# Patient Record
Sex: Female | Born: 1965 | Race: Black or African American | Hispanic: No | Marital: Single | Smoking: Never smoker
Health system: Southern US, Community
[De-identification: ages and names within clinical notes are randomized; demographics above are authoritative.]

## PROBLEM LIST (undated history)

## (undated) DIAGNOSIS — E119 Type 2 diabetes mellitus without complications: Secondary | ICD-10-CM

## (undated) DIAGNOSIS — I10 Essential (primary) hypertension: Secondary | ICD-10-CM

## (undated) DIAGNOSIS — F329 Major depressive disorder, single episode, unspecified: Secondary | ICD-10-CM

## (undated) DIAGNOSIS — F32A Depression, unspecified: Secondary | ICD-10-CM

## (undated) HISTORY — DX: Depression, unspecified: F32.A

## (undated) HISTORY — PX: ABDOMINAL HYSTERECTOMY: SHX81

---
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
## 2005-05-03 ENCOUNTER — Ambulatory Visit: Payer: Self-pay | Admitting: Family Medicine

## 2006-05-05 ENCOUNTER — Ambulatory Visit: Payer: Self-pay | Admitting: Family Medicine

## 2007-07-18 ENCOUNTER — Ambulatory Visit: Payer: Self-pay | Admitting: Family Medicine

## 2008-09-24 ENCOUNTER — Ambulatory Visit: Payer: Self-pay | Admitting: Family Medicine

## 2010-01-07 ENCOUNTER — Ambulatory Visit: Payer: Self-pay | Admitting: Family Medicine

## 2010-03-07 ENCOUNTER — Emergency Department: Payer: Self-pay | Admitting: Unknown Physician Specialty

## 2010-03-08 ENCOUNTER — Emergency Department: Payer: Self-pay | Admitting: Emergency Medicine

## 2010-07-21 ENCOUNTER — Emergency Department: Payer: Self-pay | Admitting: Emergency Medicine

## 2010-07-23 ENCOUNTER — Ambulatory Visit: Payer: Self-pay | Admitting: Obstetrics and Gynecology

## 2010-07-27 ENCOUNTER — Ambulatory Visit: Payer: Self-pay | Admitting: Obstetrics and Gynecology

## 2011-02-03 ENCOUNTER — Ambulatory Visit: Payer: Self-pay | Admitting: Family Medicine

## 2012-02-14 ENCOUNTER — Ambulatory Visit: Payer: Self-pay | Admitting: Family Medicine

## 2013-03-29 HISTORY — PX: BREAST BIOPSY: SHX20

## 2013-06-18 ENCOUNTER — Ambulatory Visit: Payer: Self-pay | Admitting: Family Medicine

## 2013-06-20 ENCOUNTER — Ambulatory Visit: Payer: Self-pay | Admitting: Family Medicine

## 2013-06-25 ENCOUNTER — Ambulatory Visit: Payer: Self-pay | Admitting: Family Medicine

## 2013-06-26 LAB — PATHOLOGY REPORT

## 2013-11-06 ENCOUNTER — Ambulatory Visit: Payer: Self-pay | Admitting: Family Medicine

## 2013-11-07 ENCOUNTER — Ambulatory Visit: Payer: Self-pay | Admitting: Gastroenterology

## 2013-11-27 ENCOUNTER — Ambulatory Visit: Payer: Self-pay | Admitting: Family Medicine

## 2014-02-04 ENCOUNTER — Ambulatory Visit: Payer: Self-pay | Admitting: Family Medicine

## 2014-02-26 ENCOUNTER — Ambulatory Visit: Payer: Self-pay | Admitting: Family Medicine

## 2014-07-04 ENCOUNTER — Ambulatory Visit
Admit: 2014-07-04 | Disposition: A | Discharge status: Home / Self Care | Payer: Self-pay | Attending: Family Medicine | Admitting: Family Medicine

## 2015-01-09 ENCOUNTER — Encounter: Payer: Self-pay | Admitting: Emergency Medicine

## 2015-01-09 ENCOUNTER — Emergency Department
Admission: EM | Admit: 2015-01-09 | Discharge: 2015-01-09 | Disposition: A | Discharge status: Home / Self Care | Payer: Federal, State, Local not specified - PPO | Attending: Emergency Medicine | Admitting: Emergency Medicine

## 2015-01-09 DIAGNOSIS — R61 Generalized hyperhidrosis: Secondary | ICD-10-CM | POA: Insufficient documentation

## 2015-01-09 DIAGNOSIS — E119 Type 2 diabetes mellitus without complications: Secondary | ICD-10-CM | POA: Diagnosis not present

## 2015-01-09 DIAGNOSIS — R079 Chest pain, unspecified: Secondary | ICD-10-CM

## 2015-01-09 DIAGNOSIS — Z79899 Other long term (current) drug therapy: Secondary | ICD-10-CM | POA: Insufficient documentation

## 2015-01-09 DIAGNOSIS — I10 Essential (primary) hypertension: Secondary | ICD-10-CM | POA: Diagnosis not present

## 2015-01-09 DIAGNOSIS — R0789 Other chest pain: Secondary | ICD-10-CM | POA: Diagnosis not present

## 2015-01-09 HISTORY — DX: Essential (primary) hypertension: I10

## 2015-01-09 HISTORY — DX: Type 2 diabetes mellitus without complications: E11.9

## 2015-01-09 LAB — COMPREHENSIVE METABOLIC PANEL
ALT: 29 U/L (ref 14–54)
ANION GAP: 11 (ref 5–15)
AST: 24 U/L (ref 15–41)
Albumin: 4.1 g/dL (ref 3.5–5.0)
Alkaline Phosphatase: 97 U/L (ref 38–126)
BILIRUBIN TOTAL: 0.7 mg/dL (ref 0.3–1.2)
BUN: 9 mg/dL (ref 6–20)
CO2: 23 mmol/L (ref 22–32)
Calcium: 9.3 mg/dL (ref 8.9–10.3)
Chloride: 106 mmol/L (ref 101–111)
Creatinine, Ser: 0.67 mg/dL (ref 0.44–1.00)
Glucose, Bld: 100 mg/dL — ABNORMAL HIGH (ref 65–99)
POTASSIUM: 3.5 mmol/L (ref 3.5–5.1)
Sodium: 140 mmol/L (ref 135–145)
TOTAL PROTEIN: 7.6 g/dL (ref 6.5–8.1)

## 2015-01-09 LAB — CBC
HEMATOCRIT: 46.5 % (ref 35.0–47.0)
Hemoglobin: 15.5 g/dL (ref 12.0–16.0)
MCH: 28.3 pg (ref 26.0–34.0)
MCHC: 33.4 g/dL (ref 32.0–36.0)
MCV: 84.8 fL (ref 80.0–100.0)
PLATELETS: 249 10*3/uL (ref 150–440)
RBC: 5.48 MIL/uL — ABNORMAL HIGH (ref 3.80–5.20)
RDW: 13.8 % (ref 11.5–14.5)
WBC: 10.3 10*3/uL (ref 3.6–11.0)

## 2015-01-09 LAB — TROPONIN I

## 2015-01-09 LAB — FIBRIN DERIVATIVES D-DIMER (ARMC ONLY): Fibrin derivatives D-dimer (ARMC): 255 (ref 0–499)

## 2015-01-09 LAB — GLUCOSE, CAPILLARY: Glucose-Capillary: 100 mg/dL — ABNORMAL HIGH (ref 65–99)

## 2015-01-09 MED ORDER — RANITIDINE HCL 150 MG PO CAPS
150.0000 mg | ORAL_CAPSULE | Freq: Two times a day (BID) | ORAL | Status: DC
Start: 2015-01-09 — End: 2016-12-08

## 2015-01-09 MED ORDER — SUCRALFATE 1 G PO TABS: 1.0000 g | ORAL_TABLET | Freq: Four times a day (QID) | ORAL | Status: DC

## 2015-01-09 NOTE — Discharge Instructions (Signed)
Nonspecific Chest Pain  °Chest pain can be caused by many different conditions. There is always a chance that your pain could be related to something serious, such as a heart attack or a blood clot in your lungs. Chest pain can also be caused by conditions that are not life-threatening. If you have chest pain, it is very important to follow up with your health care provider. °CAUSES  °Chest pain can be caused by: °· Heartburn. °· Pneumonia or bronchitis. °· Anxiety or stress. °· Inflammation around your heart (pericarditis) or lung (pleuritis or pleurisy). °· A blood clot in your lung. °· A collapsed lung (pneumothorax). It can develop suddenly on its own (spontaneous pneumothorax) or from trauma to the chest. °· Shingles infection (varicella-zoster virus). °· Heart attack. °· Damage to the bones, muscles, and cartilage that make up your chest wall. This can include: °¨ Bruised bones due to injury. °¨ Strained muscles or cartilage due to frequent or repeated coughing or overwork. °¨ Fracture to one or more ribs. °¨ Sore cartilage due to inflammation (costochondritis). °RISK FACTORS  °Risk factors for chest pain may include: °· Activities that increase your risk for trauma or injury to your chest. °· Respiratory infections or conditions that cause frequent coughing. °· Medical conditions or overeating that can cause heartburn. °· Heart disease or family history of heart disease. °· Conditions or health behaviors that increase your risk of developing a blood clot. °· Having had chicken pox (varicella zoster). °SIGNS AND SYMPTOMS °Chest pain can feel like: °· Burning or tingling on the surface of your chest or deep in your chest. °· Crushing, pressure, aching, or squeezing pain. °· Dull or sharp pain that is worse when you move, cough, or take a deep breath. °· Pain that is also felt in your back, neck, shoulder, or arm, or pain that spreads to any of these areas. °Your chest pain may come and go, or it may stay  constant. °DIAGNOSIS °Lab tests or other studies may be needed to find the cause of your pain. Your health care provider may have you take a test called an ambulatory ECG (electrocardiogram). An ECG records your heartbeat patterns at the time the test is performed. You may also have other tests, such as: °· Transthoracic echocardiogram (TTE). During echocardiography, sound waves are used to create a picture of all of the heart structures and to look at how blood flows through your heart. °· Transesophageal echocardiogram (TEE). This is a more advanced imaging test that obtains images from inside your body. It allows your health care provider to see your heart in finer detail. °· Cardiac monitoring. This allows your health care provider to monitor your heart rate and rhythm in real time. °· Holter monitor. This is a portable device that records your heartbeat and can help to diagnose abnormal heartbeats. It allows your health care provider to track your heart activity for several days, if needed. °· Stress tests. These can be done through exercise or by taking medicine that makes your heart beat more quickly. °· Blood tests. °· Imaging tests. °TREATMENT  °Your treatment depends on what is causing your chest pain. Treatment may include: °· Medicines. These may include: °¨ Acid blockers for heartburn. °¨ Anti-inflammatory medicine. °¨ Pain medicine for inflammatory conditions. °¨ Antibiotic medicine, if an infection is present. °¨ Medicines to dissolve blood clots. °¨ Medicines to treat coronary artery disease. °· Supportive care for conditions that do not require medicines. This may include: °¨ Resting. °¨ Applying heat   or cold packs to injured areas. °¨ Limiting activities until pain decreases. °HOME CARE INSTRUCTIONS °· If you were prescribed an antibiotic medicine, finish it all even if you start to feel better. °· Avoid any activities that bring on chest pain. °· Do not use any tobacco products, including  cigarettes, chewing tobacco, or electronic cigarettes. If you need help quitting, ask your health care provider. °· Do not drink alcohol. °· Take medicines only as directed by your health care provider. °· Keep all follow-up visits as directed by your health care provider. This is important. This includes any further testing if your chest pain does not go away. °· If heartburn is the cause for your chest pain, you may be told to keep your head raised (elevated) while sleeping. This reduces the chance that acid will go from your stomach into your esophagus. °· Make lifestyle changes as directed by your health care provider. These may include: °¨ Getting regular exercise. Ask your health care provider to suggest some activities that are safe for you. °¨ Eating a heart-healthy diet. A registered dietitian can help you to learn healthy eating options. °¨ Maintaining a healthy weight. °¨ Managing diabetes, if necessary. °¨ Reducing stress. °SEEK MEDICAL CARE IF: °· Your chest pain does not go away after treatment. °· You have a rash with blisters on your chest. °· You have a fever. °SEEK IMMEDIATE MEDICAL CARE IF:  °· Your chest pain is worse. °· You have an increasing cough, or you cough up blood. °· You have severe abdominal pain. °· You have severe weakness. °· You faint. °· You have chills. °· You have sudden, unexplained chest discomfort. °· You have sudden, unexplained discomfort in your arms, back, neck, or jaw. °· You have shortness of breath at any time. °· You suddenly start to sweat, or your skin gets clammy. °· You feel nauseous or you vomit. °· You suddenly feel light-headed or dizzy. °· Your heart begins to beat quickly, or it feels like it is skipping beats. °These symptoms may represent a serious problem that is an emergency. Do not wait to see if the symptoms will go away. Get medical help right away. Call your local emergency services (911 in the [place] Do not drive yourself to the hospital. °  °This  information is not intended to replace advice given to you by your health care provider. Make sure you discuss any questions you have with your health care provider. °  °Document Released: 12/23/2004 Document Revised: 04/05/2014 Document Reviewed: 10/19/2013 °Elsevier Interactive Patient Education ©2016 [employer] ° °

## 2015-01-09 NOTE — ED Notes (Signed)
Pt to ed with c/o chest pain that started about 30 min pta.  Pt states it is in the center of her chest and caused her to feel weak and lightheaded.  Pt also reports severe diaphoresis with the chest pain.

## 2015-01-09 NOTE — ED Provider Notes (Signed)
[facility] Emergency Department Provider Note  ____________________________________________  Time seen: 5:00 PM  I have reviewed the triage vital signs and the nursing notes.   HISTORY  Chief Complaint Chest Pain    HPI Melissa Duncan is a 49 y.o. female who complains of chest pain that started about 1:30 PM today. With the onset of chest pain which was fairly sudden, she had diaphoresis. It's nonradiating, sharp, pleuritic in nature and constant. Never had anything like this before. Slightly worsened with movement. No exertional symptoms. No vomiting dizziness or syncope. No shortness of breath.  No recent hospitalization or surgery. The patient did recently travel by car to [place] completing a round trip within the last week. No history of DVT or PE. No leg pain or swelling. No trauma   Past Medical History  Diagnosis Date  . Hypertension   . Diabetes mellitus without complication (HCC)    prediabetes, diet controlled   There are no active problems to display for this patient.    History reviewed. No pertinent past surgical history.   Current Outpatient Rx  Name  Route  Sig  Dispense  Refill  . irbesartan-hydrochlorothiazide (AVALIDE) 150-12.5 MG tablet   Oral   Take 1 tablet by mouth daily.      0   . sertraline (ZOLOFT) 100 MG tablet   Oral   Take 2 tablets by mouth daily.      0   . ranitidine (ZANTAC) 150 MG capsule   Oral   Take 1 capsule (150 mg total) by mouth 2 (two) times daily.   28 capsule   0   . sucralfate (CARAFATE) 1 G tablet   Oral   Take 1 tablet (1 g total) by mouth 4 (four) times daily.   120 tablet   1      Allergies Ibuprofen and Wellbutrin   Family History  Problem Relation Age of Onset  . Adopted: Yes    Social History Social History  Substance Use Topics  . Smoking status: Never Smoker   . Smokeless tobacco: None  . Alcohol Use: No    Review of Systems  Constitutional:   No fever or  chills. No weight changes Eyes:   No blurry vision or double vision.  ENT:   No sore throat. Cardiovascular:   Positive as above chest pain. Respiratory:   No dyspnea or cough. Gastrointestinal:   Negative for abdominal pain, vomiting and diarrhea.  No BRBPR or melena. Genitourinary:   Negative for dysuria, urinary retention, bloody urine, or difficulty urinating. Musculoskeletal:   Negative for back pain. No joint swelling or pain. Skin:   Negative for rash. Neurological:   Negative for headaches, focal weakness or numbness. Psychiatric:  No anxiety or depression.   Endocrine:  No hot/cold intolerance, changes in energy, or sleep difficulty.  10-point ROS otherwise negative.  ____________________________________________   PHYSICAL EXAM:  VITAL SIGNS: ED Triage Vitals  Enc Vitals Group     BP 01/09/15 1416 126/86 mmHg     Pulse Rate 01/09/15 1416 82     Resp 01/09/15 1730 13     Temp 01/09/15 1416 98.7 F (37.1 C)     Temp Source 01/09/15 1416 Oral     SpO2 01/09/15 1416 93 %     Weight 01/09/15 1633 175 lb (79.379 kg)     Height 01/09/15 1633 5\' 2"  (1.575 m)     Head Cir --      Peak Flow --  Pain Score 01/09/15 1407 10     Pain Loc --      Pain Edu? --      Excl. in GC? --      Constitutional:   Alert and oriented. Well appearing and in no distress. Nontoxic, calm and comfortable Eyes:   No scleral icterus. No conjunctival pallor. PERRL. EOMI ENT   Head:   Normocephalic and atraumatic.   Nose:   No congestion/rhinnorhea. No septal hematoma   Mouth/Throat:   MMM, no pharyngeal erythema. No peritonsillar mass. No uvula shift.   Neck:   No stridor. No SubQ emphysema. No meningismus. Hematological/Lymphatic/Immunilogical:   No cervical lymphadenopathy. Cardiovascular:   RRR. Normal and symmetric distal pulses are present in all extremities. No murmurs, rubs, or gallops. Respiratory:   Normal respiratory effort without tachypnea nor retractions. Breath  sounds are clear and equal bilaterally. No wheezes/rales/rhonchi. Gastrointestinal:   Soft and nontender. No distention. There is no CVA tenderness.  No rebound, rigidity, or guarding. Genitourinary:   deferred Musculoskeletal:   Nontender with normal range of motion in all extremities. No joint effusions.  No lower extremity tenderness.  No edema. Neurologic:   Normal speech and language.  CN 2-10 normal. Motor grossly intact. No pronator drift.  Normal gait. No gross focal neurologic deficits are appreciated.  Skin:    Skin is warm, dry and intact. No rash noted.  No petechiae, purpura, or bullae. Psychiatric:   Mood and affect are normal. Speech and behavior are normal. Patient exhibits appropriate insight and judgment.  ____________________________________________    LABS (pertinent positives/negatives) (all labs ordered are listed, but only abnormal results are displayed) Labs Reviewed  GLUCOSE, CAPILLARY - Abnormal; Notable for the following:    Glucose-Capillary 100 (*)    All other components within normal limits  CBC - Abnormal; Notable for the following:    RBC 5.48 (*)    All other components within normal limits  COMPREHENSIVE METABOLIC PANEL - Abnormal; Notable for the following:    Glucose, Bld 100 (*)    All other components within normal limits  TROPONIN I  TROPONIN I  FIBRIN DERIVATIVES D-DIMER ([facility] ONLY)   ____________________________________________   EKG  Interpreted by me Normal sinus rhythm rate of 91, normal axis intervals QRS and ST segments and T waves.  ____________________________________________    RADIOLOGY  ____________________________________________   PROCEDURES   ____________________________________________   INITIAL IMPRESSION / ASSESSMENT AND PLAN / ED COURSE  Pertinent labs & imaging results that were available during my care of the patient were reviewed by me and considered in my medical decision making (see chart for  details).  Patient presents with a sharp pleuritic chest pain of only a few hours duration. No suspicion for ACS TAD pneumothorax carditis mediastinitis pneumonia or sepsis. With her recent travel she is at risk for venous thromboembolism. I overall have a low suspicion for PE especially with her normal vital signs blood check a d-dimer and serial troponin for further risk stratification. Patient reports pain has abated on its own and is now about a 2 out of 10.  ----------------------------------------- 7:41 PM on 01/09/2015 -----------------------------------------  Workup negative. Serial troponin negative. D-dimer within normal limits. Patient very calm and comfortable. Remained stable. We'll discharge her home and have her take a trial of antacids and follow up with primary care within a week.     ____________________________________________   FINAL CLINICAL IMPRESSION(S) / ED DIAGNOSES  Final diagnoses:  Chest pain, unspecified chest pain type  Sharman Cheek, MD 01/09/15 [phone]

## 2015-08-05 ENCOUNTER — Other Ambulatory Visit: Payer: Self-pay | Admitting: Family Medicine

## 2015-08-05 DIAGNOSIS — Z1231 Encounter for screening mammogram for malignant neoplasm of breast: Secondary | ICD-10-CM

## 2015-08-22 ENCOUNTER — Ambulatory Visit
Admission: RE | Admit: 2015-08-22 | Discharge: 2015-08-22 | Disposition: A | Discharge status: Home / Self Care | Payer: Federal, State, Local not specified - PPO | Source: Ambulatory Visit | Attending: Family Medicine | Admitting: Family Medicine

## 2015-08-22 DIAGNOSIS — Z1231 Encounter for screening mammogram for malignant neoplasm of breast: Secondary | ICD-10-CM | POA: Insufficient documentation

## 2016-08-24 ENCOUNTER — Other Ambulatory Visit: Payer: Self-pay | Admitting: Family Medicine

## 2016-08-24 DIAGNOSIS — Z1231 Encounter for screening mammogram for malignant neoplasm of breast: Secondary | ICD-10-CM

## 2016-09-15 ENCOUNTER — Ambulatory Visit
Admission: RE | Admit: 2016-09-15 | Discharge: 2016-09-15 | Disposition: A | Discharge status: Home / Self Care | Payer: Federal, State, Local not specified - PPO | Source: Ambulatory Visit | Attending: Family Medicine | Admitting: Family Medicine

## 2016-09-15 DIAGNOSIS — Z1231 Encounter for screening mammogram for malignant neoplasm of breast: Secondary | ICD-10-CM | POA: Insufficient documentation

## 2016-12-06 ENCOUNTER — Emergency Department
Admission: EM | Admit: 2016-12-06 | Discharge: 2016-12-07 | Disposition: A | Discharge status: Other Acute Inpatient Hospital | Payer: Federal, State, Local not specified - PPO | Attending: Emergency Medicine | Admitting: Emergency Medicine

## 2016-12-06 DIAGNOSIS — E119 Type 2 diabetes mellitus without complications: Secondary | ICD-10-CM | POA: Insufficient documentation

## 2016-12-06 DIAGNOSIS — F329 Major depressive disorder, single episode, unspecified: Secondary | ICD-10-CM | POA: Insufficient documentation

## 2016-12-06 DIAGNOSIS — I10 Essential (primary) hypertension: Secondary | ICD-10-CM | POA: Insufficient documentation

## 2016-12-06 DIAGNOSIS — Z79899 Other long term (current) drug therapy: Secondary | ICD-10-CM | POA: Diagnosis not present

## 2016-12-06 DIAGNOSIS — F332 Major depressive disorder, recurrent severe without psychotic features: Secondary | ICD-10-CM | POA: Diagnosis not present

## 2016-12-06 DIAGNOSIS — F32A Depression, unspecified: Secondary | ICD-10-CM

## 2016-12-06 LAB — CBC
HCT: 42.4 % (ref 35.0–47.0)
Hemoglobin: 14.6 g/dL (ref 12.0–16.0)
MCH: 29.2 pg (ref 26.0–34.0)
MCHC: 34.5 g/dL (ref 32.0–36.0)
MCV: 84.6 fL (ref 80.0–100.0)
Platelets: 264 10*3/uL (ref 150–440)
RBC: 5.01 MIL/uL (ref 3.80–5.20)
RDW: 14.3 % (ref 11.5–14.5)
WBC: 11 10*3/uL (ref 3.6–11.0)

## 2016-12-06 LAB — COMPREHENSIVE METABOLIC PANEL
ALT: 33 U/L (ref 14–54)
AST: 29 U/L (ref 15–41)
Albumin: 4.3 g/dL (ref 3.5–5.0)
Alkaline Phosphatase: 73 U/L (ref 38–126)
Anion gap: 9 (ref 5–15)
BUN: 15 mg/dL (ref 6–20)
CHLORIDE: 107 mmol/L (ref 101–111)
CO2: 24 mmol/L (ref 22–32)
CREATININE: 0.73 mg/dL (ref 0.44–1.00)
Calcium: 9.5 mg/dL (ref 8.9–10.3)
GFR calc non Af Amer: >60 mL/min (ref >60)
Glucose, Bld: 97 mg/dL (ref 65–99)
Potassium: 3.8 mmol/L (ref 3.5–5.1)
SODIUM: 140 mmol/L (ref 135–145)
Total Bilirubin: 1.3 mg/dL — ABNORMAL HIGH (ref 0.3–1.2)
Total Protein: 7.8 g/dL (ref 6.5–8.1)

## 2016-12-06 LAB — ACETAMINOPHEN LEVEL: Acetaminophen (Tylenol), Serum: <10 ug/mL — ABNORMAL LOW (ref 10–30)

## 2016-12-06 LAB — ETHANOL: Alcohol, Ethyl (B): <5 mg/dL (ref <5)

## 2016-12-06 LAB — SALICYLATE LEVEL

## 2016-12-06 MED ORDER — ARIPIPRAZOLE 5 MG PO TABS
5.0000 mg | ORAL_TABLET | Freq: Every day | ORAL | Status: DC
  Administered 2016-12-07 (×2): 5 mg via ORAL
  Filled 2016-12-06 (×2): qty 1

## 2016-12-06 MED ORDER — SERTRALINE HCL 100 MG PO TABS
200.0000 mg | ORAL_TABLET | Freq: Every day | ORAL | Status: DC
  Administered 2016-12-07 (×2): 200 mg via ORAL
  Filled 2016-12-06 (×2): qty 2

## 2016-12-06 MED ORDER — FAMOTIDINE 20 MG PO TABS
20.0000 mg | ORAL_TABLET | Freq: Two times a day (BID) | ORAL | Status: DC
  Administered 2016-12-06 – 2016-12-07 (×2): 20 mg via ORAL
  Filled 2016-12-06 (×2): qty 1

## 2016-12-06 MED ORDER — TRIAMTERENE-HCTZ 37.5-25 MG PO TABS
0.5000 | ORAL_TABLET | Freq: Every day | ORAL | Status: DC
  Administered 2016-12-07 (×2): 0.5 via ORAL
  Filled 2016-12-06 (×4): qty 0.5

## 2016-12-06 MED ORDER — SUCRALFATE 1 G PO TABS
1.0000 g | ORAL_TABLET | Freq: Three times a day (TID) | ORAL | Status: DC
  Administered 2016-12-07 (×2): 1 g via ORAL
  Filled 2016-12-06 (×5): qty 1

## 2016-12-06 MED ORDER — HYDROMORPHONE HCL 1 MG/ML IJ SOLN: 0.5000 mg | Freq: Once | INTRAMUSCULAR | Status: DC

## 2016-12-06 NOTE — ED Triage Notes (Signed)
Pt brought in via [facility] under IVC from [facility] , states her husband cheated on her and left her this past Thursday and she [facility] that she was having suicidal thoughts.

## 2016-12-06 NOTE — ED Notes (Signed)

## 2016-12-06 NOTE — ED Notes (Signed)
BEHAVIORAL HEALTH ROUNDING Patient sleeping: No. Patient alert and oriented: yes Behavior appropriate: Yes.  ; If no, describe:  Nutrition and fluids offered: Yes  Toileting and hygiene offered: Yes  Sitter present: not applicable Law enforcement present: Yes  

## 2016-12-06 NOTE — ED Notes (Signed)
Alert and oriented woman arrives ambulatory with steady gait to room 22. States has had thoughts of harming self. Denies specific plan. Denies doing anything to harm self at this point.

## 2016-12-06 NOTE — Consult Note (Signed)
[facility] Consult   Reason for Consult:  Consult for 51 year old woman with a history of depression who was petitioned by her primary psychiatrist today Referring Physician:  Siadecki Patient Identification: Melissa Duncan MRN:  [id number] Principal Diagnosis: Severe recurrent major depression without psychotic features [facility]) Diagnosis:   Patient Active Problem List   Diagnosis Date Noted  . Severe recurrent major depression without psychotic features ([place]) [F33.2] 12/06/2016  . Hypertension [I10] 12/06/2016    Total Time spent with patient: 1 hour  Subjective:   Melissa Duncan is a 51 y.o. female patient admitted with "Dr. Nicolasa Ducking thinks I'm suicidal".  HPI:  Patient interviewed chart reviewed. 51 year old woman with a history of recurrent major depression. Depression has been worse for the last couple months. Gradually getting worse.. Feels hopeless and down most of the time. No longer able to work. Frequent crying spells. Admits that she is sleeping very poorly at night with frequent wakening. Not eating or taking care of herself well. She has been noncompliant with her prescribed antidepressant medicine. Endorses suicidal thoughts although denies any specific plan. Denies psychosis. Not abusing alcohol or drugs. Major stress is that her husband has left her for another woman.  Social history: Currently staying partially by herself although it sounds like she has some other relatives at times intervening to help her out. Her second husband has left her for another woman. 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  Medical history: High blood pressure. What she calls "borderline diabetes".  Substance abuse history: Denies alcohol or drug abuse current or past  Past Psychiatric History: Patient has been diagnosed with depression and treated in the past with good success with Zoloft and Abilify. More recently Abilify  had been supposedly losing its effectiveness. Patient has tried other medications including Rexulti. She denies currently having any psychotic symptoms. She admits that she has been noncompliant with medicine saying that it is just too much trouble to go pick up the medicines from the pharmacy.  Risk to Self: Is patient at risk for suicide?: No Risk to Others:   Prior Inpatient Therapy:   Prior Outpatient Therapy:    Past Medical History:  Past Medical History:  Diagnosis Date  . Diabetes mellitus without complication ([place])   . Hypertension     Past Surgical History:  Procedure Laterality Date  . ABDOMINAL HYSTERECTOMY    . BREAST BIOPSY Right 2015   neg   Family History:  Family History  Problem Relation Age of Onset  . Adopted: Yes   Family Psychiatric  History: She does not know if she is adopted Social History:  History  Alcohol Use No     History  Drug Use No    Social History   Social History  . Marital status: Married    Spouse name: N/A  . Number of children: N/A  . Years of education: N/A   Social History Main Topics  . Smoking status: Never Smoker  . Smokeless tobacco: Never Used  . Alcohol use No  . Drug use: No  . Sexual activity: Not Asked   Other Topics Concern  . None   Social History Narrative  . None   Additional Social History:    Allergies:   Allergies  Allergen Reactions  . Ibuprofen Other (See Comments)    Eye swelling  . Wellbutrin [Bupropion] Rash    Labs:  Results for orders placed or performed during  the hospital encounter of 12/06/16 (from the past 48 hour(s))  Comprehensive metabolic panel     Status: Abnormal   Collection Time: 12/06/16  6:31 PM  Result Value Ref Range   Sodium 140 135 - 145 mmol/L   Potassium 3.8 3.5 - 5.1 mmol/L   Chloride 107 101 - 111 mmol/L   CO2 24 22 - 32 mmol/L   Glucose, Bld 97 65 - 99 mg/dL   BUN 15 6 - 20 mg/dL   Creatinine, Ser 0.73 0.44 - 1.00 mg/dL   Calcium 9.5 8.9 - 10.3 mg/dL    Total Protein 7.8 6.5 - 8.1 g/dL   Albumin 4.3 3.5 - 5.0 g/dL   AST 29 15 - 41 U/L   ALT 33 14 - 54 U/L   Alkaline Phosphatase 73 38 - 126 U/L   Total Bilirubin 1.3 (H) 0.3 - 1.2 mg/dL   GFR calc non Af Amer >60 >60 mL/min   GFR calc Af Amer >60 >60 mL/min    Comment: (NOTE) The eGFR has been calculated using the CKD EPI equation. This calculation has not been validated in all clinical situations. eGFR's persistently <60 mL/min signify possible Chronic Kidney Disease.    Anion gap 9 5 - 15  Ethanol     Status: None   Collection Time: 12/06/16  6:31 PM  Result Value Ref Range   Alcohol, Ethyl (B) <5 <5 mg/dL    Comment:        LOWEST DETECTABLE LIMIT FOR SERUM ALCOHOL IS 5 mg/dL FOR MEDICAL PURPOSES ONLY   Salicylate level     Status: None   Collection Time: 12/06/16  6:31 PM  Result Value Ref Range   Salicylate Lvl <4.7 2.8 - 30.0 mg/dL  Acetaminophen level     Status: Abnormal   Collection Time: 12/06/16  6:31 PM  Result Value Ref Range   Acetaminophen (Tylenol), Serum <10 (L) 10 - 30 ug/mL    Comment:        THERAPEUTIC CONCENTRATIONS VARY SIGNIFICANTLY. A RANGE OF 10-30 ug/mL MAY BE AN EFFECTIVE CONCENTRATION FOR MANY PATIENTS. HOWEVER, SOME ARE BEST TREATED AT CONCENTRATIONS OUTSIDE THIS RANGE. ACETAMINOPHEN CONCENTRATIONS >150 ug/mL AT 4 HOURS AFTER INGESTION AND >50 ug/mL AT 12 HOURS AFTER INGESTION ARE OFTEN ASSOCIATED WITH TOXIC REACTIONS.   cbc     Status: None   Collection Time: 12/06/16  6:31 PM  Result Value Ref Range   WBC 11.0 3.6 - 11.0 K/uL   RBC 5.01 3.80 - 5.20 MIL/uL   Hemoglobin 14.6 12.0 - 16.0 g/dL   HCT 42.4 35.0 - 47.0 %   MCV 84.6 80.0 - 100.0 fL   MCH 29.2 26.0 - 34.0 pg   MCHC 34.5 32.0 - 36.0 g/dL   RDW 14.3 11.5 - 14.5 %   Platelets 264 150 - 440 K/uL    Current Facility-Administered Medications  Medication Dose Route Frequency Provider Last Rate Last Dose  . ARIPiprazole (ABILIFY) tablet 5 mg  5 mg Oral Daily Clapacs, John  T, MD      . famotidine (PEPCID) tablet 20 mg  20 mg Oral BID Clapacs, John T, MD      . HYDROmorphone (DILAUDID) injection 0.5 mg  0.5 mg Intravenous Once Arta Silence, MD      . sertraline (ZOLOFT) tablet 200 mg  200 mg Oral Daily Clapacs, John T, MD      . sucralfate (CARAFATE) tablet 1 g  1 g Oral TID WC & HS Clapacs, John  T, MD      . triamterene-hydrochlorothiazide (MAXZIDE-25) 37.5-25 MG per tablet 0.5 tablet  0.5 tablet Oral Daily Clapacs, Madie Reno, MD       Current Outpatient Prescriptions  Medication Sig Dispense Refill  . ARIPiprazole (ABILIFY) 5 MG tablet Take 2.5-5 mg by mouth See admin instructions. Take 2.5 mg by mouth for 5 days, then take 5 mg by mouth once daily.  0  . irbesartan-hydrochlorothiazide (AVALIDE) 150-12.5 MG tablet Take 1 tablet by mouth daily.  0  . sertraline (ZOLOFT) 100 MG tablet Take 2 tablets by mouth daily.  0  . ranitidine (ZANTAC) 150 MG capsule Take 1 capsule (150 mg total) by mouth 2 (two) times daily. (Patient not taking: Reported on 12/06/2016) 28 capsule 0  . sucralfate (CARAFATE) 1 G tablet Take 1 tablet (1 g total) by mouth 4 (four) times daily. (Patient not taking: Reported on 12/06/2016) 120 tablet 1    Musculoskeletal: Strength & Muscle Tone: within normal limits Gait & Station: normal Patient leans: N/A  Psychiatric Specialty Exam: Physical Exam  Nursing note and vitals reviewed. Constitutional: She appears well-developed and well-nourished.  HENT:  Head: Normocephalic and atraumatic.  Eyes: Pupils are equal, round, and reactive to light. Conjunctivae are normal.  Neck: Normal range of motion.  Cardiovascular: Regular rhythm and normal heart sounds.   Respiratory: Effort normal. No respiratory distress.  GI: Soft.  Musculoskeletal: Normal range of motion.  Neurological: She is alert.  Skin: Skin is warm and dry.  Psychiatric: Judgment normal. Her affect is blunt. Her speech is delayed. She is slowed. Cognition and memory are  normal. She expresses suicidal ideation.    Review of Systems  Constitutional: Negative.   HENT: Negative.   Eyes: Negative.   Respiratory: Negative.   Cardiovascular: Negative.   Gastrointestinal: Negative.   Musculoskeletal: Negative.   Skin: Negative.   Neurological: Negative.   Psychiatric/Behavioral: Positive for depression and suicidal ideas. Negative for hallucinations, memory loss and substance abuse. The patient is nervous/anxious and has insomnia.     Blood pressure (!) 143/90, pulse 87, temperature 98.2 F (36.8 C), temperature source Oral, resp. rate 16, height _0  (1.575 m), weight 73.9 kg (163 lb), SpO2 96 %.Body mass index is 29.81 kg/m.  General Appearance: Casual  Eye Contact:  Minimal  Speech:  Slow  Volume:  Decreased  Mood:  Depressed  Affect:  Congruent  Thought Process:  Goal Directed  Orientation:  Full (Time, Place, and Person)  Thought Content:  Logical  Suicidal Thoughts:  Yes.  without intent/plan  Homicidal Thoughts:  No  Memory:  Immediate;   Good Recent;   Fair Remote;   Fair  Judgement:  Impaired  Insight:  Fair  Psychomotor Activity:  Decreased  Concentration:  Concentration: Fair  Recall:  [employer] of Knowledge:  Fair  Language:  Fair  Akathisia:  No  Handed:  Right  AIMS (if indicated):     Assets:  Communication Skills Desire for Improvement Housing Physical Health Social Support  ADL's:  Intact  Cognition:  WNL  Sleep:        Treatment Plan Summary: Daily contact with patient to assess and evaluate symptoms and progress in treatment, Medication management and Plan Patient is a 51 year old woman with severe recurrent depression. Under IVC. Continue the IVC order. Restart outpatient medications as best we can including the Zoloft and Abilify. Patient will be admitted to the psychiatric unit when a bed is available. Orders completed for admission. Full  set of labs to be obtained. Patient understands the plan. Case reviewed with  nursing and emergency room physician and TTS.  Disposition: Recommend psychiatric Inpatient admission when medically cleared. Supportive therapy provided about ongoing stressors.  Alethia Berthold, MD 12/06/2016 8:31 PM

## 2016-12-06 NOTE — ED Provider Notes (Signed)
[facility] Emergency Department Provider Note ____________________________________________   First MD Initiated Contact with Patient 12/06/16 1857     (approximate)  I have reviewed the triage vital signs and the nursing notes.   HISTORY  Chief Complaint Psychiatric Evaluation    HPI Melissa Duncan is a 51 y.o. female with history of diabetes, hypertension, and depression who presents with suicidal ideation. Patient was made IVC and referred from her outpatient treatment after she expressed suicidal thoughts to her psychiatrist. Patient states his suicidal ideation was precipitated by her husband cheating on her. She denies having an active plan at this time. Denies HI. She reports that she had some chest pain yesterday that resolved; she states that she thinks it was related to stress. Otherwise she denies any acute medical complaints.  Past Medical History:  Diagnosis Date  . Diabetes mellitus without complication (HCC)   . Hypertension     Patient Active Problem List   Diagnosis Date Noted  . Severe recurrent major depression without psychotic features (HCC) 12/06/2016  . Hypertension 12/06/2016    Past Surgical History:  Procedure Laterality Date  . ABDOMINAL HYSTERECTOMY    . BREAST BIOPSY Right 2015   neg    Prior to Admission medications   Medication Sig Start Date End Date Taking? Authorizing Provider  ARIPiprazole (ABILIFY) 5 MG tablet Take 2.5-5 mg by mouth See admin instructions. Take 2.5 mg by mouth for 5 days, then take 5 mg by mouth once daily. 12/03/16  Yes [provider]  irbesartan-hydrochlorothiazide (AVALIDE) 150-12.5 MG tablet Take 1 tablet by mouth daily. 12/27/14  Yes [provider]  sertraline (ZOLOFT) 100 MG tablet Take 2 tablets by mouth daily. 12/27/14  Yes [provider]  ranitidine (ZANTAC) 150 MG capsule Take 1 capsule (150 mg total) by mouth 2 (two) times daily. Patient not taking: Reported  on 12/06/2016 01/09/15   Sharman CheekStafford, Phillip, MD  sucralfate (CARAFATE) 1 G tablet Take 1 tablet (1 g total) by mouth 4 (four) times daily. Patient not taking: Reported on 12/06/2016 01/09/15   Sharman CheekStafford, Phillip, MD    Allergies Ibuprofen and Wellbutrin [bupropion]  Family History  Problem Relation Age of Onset  . Adopted: Yes    Social History Social History  Substance Use Topics  . Smoking status: Never Smoker  . Smokeless tobacco: Never Used  . Alcohol use No    Review of Systems  Constitutional: No fever/chills Eyes: No visual changes. ENT: No sore throat. Cardiovascular: Positive for resolved chest pain. Respiratory: Denies shortness of breath. Gastrointestinal: No nausea, no vomiting.  No diarrhea.  Genitourinary: Negative for dysuria, positive for mild suprapubic pain.  Musculoskeletal: Negative for back pain. Skin: Negative for rash. Neurological: Negative for headaches, focal weakness or numbness.   ____________________________________________   PHYSICAL EXAM:  VITAL SIGNS: ED Triage Vitals  Enc Vitals Group     BP 12/06/16 1831 (!) 143/90     Pulse Rate 12/06/16 1831 87     Resp 12/06/16 1831 16     Temp 12/06/16 1831 98.2 F (36.8 C)     Temp Source 12/06/16 1831 Oral     SpO2 12/06/16 1831 96 %     Weight 12/06/16 1832 163 lb (73.9 kg)     Height 12/06/16 1832 5\' 2"  (1.575 m)     Head Circumference --      Peak Flow --      Pain Score --      Pain Loc --  Pain Edu? --      Excl. in GC? --     Constitutional: Alert and oriented. Well appearing and in no acute distress. Eyes: Conjunctivae are normal.  Head: Atraumatic. Nose: No congestion/rhinnorhea. Mouth/Throat: Mucous membranes are moist.   Neck: Normal range of motion.  Cardiovascular: Good peripheral circulation. Respiratory: Normal respiratory effort.   Gastrointestinal: No distention.  Genitourinary: No CVA tenderness. Musculoskeletal: Extremities warm and well perfused.    Neurologic:  Normal speech and language. No gross focal neurologic deficits are appreciated.  Skin:  Skin is warm and dry. No rash noted. Psychiatric: Speech and behavior are normal.  ____________________________________________   LABS (all labs ordered are listed, but only abnormal results are displayed)  Labs Reviewed  COMPREHENSIVE METABOLIC PANEL - Abnormal; Notable for the following:       Result Value   Total Bilirubin 1.3 (*)    All other components within normal limits  ACETAMINOPHEN LEVEL - Abnormal; Notable for the following:    Acetaminophen (Tylenol), Serum <10 (*)    All other components within normal limits  ETHANOL  SALICYLATE LEVEL  CBC  URINE DRUG SCREEN, QUALITATIVE ([facility] ONLY)  URINALYSIS, COMPLETE ([facility]) WITH MICROSCOPIC   ____________________________________________  EKG  ED ECG REPORT I, Dionne Bucy, the attending physician, personally viewed and interpreted this ECG.  Date: 12/06/2016 EKG Time: 1954 Rate: 73 Rhythm: normal sinus rhythm QRS Axis: normal Intervals: normal ST/T Wave abnormalities: normal Narrative Interpretation: no evidence of acute ischemia  ____________________________________________  RADIOLOGY    ____________________________________________   PROCEDURES  Procedure(s) performed: No    Critical Care performed: No ____________________________________________   INITIAL IMPRESSION / ASSESSMENT AND PLAN / ED COURSE  Pertinent labs & imaging results that were available during my care of the patient were reviewed by me and considered in my medical decision making (see chart for details).  Patient was referred to ED and made IVC after expressing suicidal thoughts to her psychiatrist. Signs are normal, patient is comfortable appearing and exam is unremarkable.  she reports resolved chest pain yesterday which she associates with stress and also reports mild suprapubic discomfort but no urinary symptoms. We'll  obtain EKG, medical screening labs, and likely medically clear pending psych eval.       ____________________________________________   FINAL CLINICAL IMPRESSION(S) / ED DIAGNOSES  Final diagnoses:  Depression, unspecified depression type      NEW MEDICATIONS STARTED DURING THIS VISIT:  New Prescriptions   No medications on file     Note:  This document was prepared using Dragon voice recognition software and may include unintentional dictation errors.    Dionne Bucy, MD 12/06/16 2253

## 2016-12-07 ENCOUNTER — Inpatient Hospital Stay
Admission: EM | Admit: 2016-12-07 | Discharge: 2016-12-08 | DRG: 885 | Disposition: A | Discharge status: Home / Self Care | Payer: Federal, State, Local not specified - PPO | Source: Intra-hospital | Attending: Psychiatry | Admitting: Psychiatry

## 2016-12-07 DIAGNOSIS — E119 Type 2 diabetes mellitus without complications: Secondary | ICD-10-CM | POA: Diagnosis present

## 2016-12-07 DIAGNOSIS — Z9114 Patient's other noncompliance with medication regimen: Secondary | ICD-10-CM | POA: Diagnosis not present

## 2016-12-07 DIAGNOSIS — Z90710 Acquired absence of both cervix and uterus: Secondary | ICD-10-CM

## 2016-12-07 DIAGNOSIS — F419 Anxiety disorder, unspecified: Secondary | ICD-10-CM | POA: Diagnosis present

## 2016-12-07 DIAGNOSIS — I10 Essential (primary) hypertension: Secondary | ICD-10-CM | POA: Diagnosis present

## 2016-12-07 DIAGNOSIS — K219 Gastro-esophageal reflux disease without esophagitis: Secondary | ICD-10-CM | POA: Diagnosis present

## 2016-12-07 DIAGNOSIS — Z888 Allergy status to other drugs, medicaments and biological substances status: Secondary | ICD-10-CM | POA: Diagnosis not present

## 2016-12-07 DIAGNOSIS — F332 Major depressive disorder, recurrent severe without psychotic features: Secondary | ICD-10-CM | POA: Diagnosis present

## 2016-12-07 DIAGNOSIS — G4700 Insomnia, unspecified: Secondary | ICD-10-CM | POA: Diagnosis present

## 2016-12-07 DIAGNOSIS — Z630 Problems in relationship with spouse or partner: Secondary | ICD-10-CM

## 2016-12-07 DIAGNOSIS — Z8711 Personal history of peptic ulcer disease: Secondary | ICD-10-CM

## 2016-12-07 DIAGNOSIS — R45851 Suicidal ideations: Secondary | ICD-10-CM | POA: Diagnosis present

## 2016-12-07 DIAGNOSIS — Z886 Allergy status to analgesic agent status: Secondary | ICD-10-CM

## 2016-12-07 DIAGNOSIS — F329 Major depressive disorder, single episode, unspecified: Secondary | ICD-10-CM | POA: Diagnosis not present

## 2016-12-07 MED ORDER — SUCRALFATE 1 G PO TABS
1.0000 g | ORAL_TABLET | Freq: Three times a day (TID) | ORAL | Status: DC
  Administered 2016-12-07 – 2016-12-08 (×3): 1 g via ORAL
  Filled 2016-12-07 (×6): qty 1

## 2016-12-07 MED ORDER — ACETAMINOPHEN 325 MG PO TABS: 650.0000 mg | ORAL_TABLET | Freq: Four times a day (QID) | ORAL | Status: DC | PRN

## 2016-12-07 MED ORDER — TRAZODONE HCL 100 MG PO TABS: 100.0000 mg | ORAL_TABLET | Freq: Every evening | ORAL | Status: DC | PRN

## 2016-12-07 MED ORDER — MAGNESIUM HYDROXIDE 400 MG/5ML PO SUSP: 30.0000 mL | Freq: Every day | ORAL | Status: DC | PRN

## 2016-12-07 MED ORDER — FAMOTIDINE 20 MG PO TABS
20.0000 mg | ORAL_TABLET | Freq: Two times a day (BID) | ORAL | Status: DC
  Administered 2016-12-07 – 2016-12-08 (×2): 20 mg via ORAL
  Filled 2016-12-07 (×3): qty 1

## 2016-12-07 MED ORDER — ARIPIPRAZOLE 5 MG PO TABS
5.0000 mg | ORAL_TABLET | Freq: Every day | ORAL | Status: DC
  Administered 2016-12-08: 5 mg via ORAL
  Filled 2016-12-07: qty 1

## 2016-12-07 MED ORDER — TRIAMTERENE-HCTZ 37.5-25 MG PO TABS
0.5000 | ORAL_TABLET | Freq: Every day | ORAL | Status: DC
  Administered 2016-12-08: 0.5 via ORAL
  Filled 2016-12-07 (×2): qty 0.5

## 2016-12-07 MED ORDER — ALUM & MAG HYDROXIDE-SIMETH 200-200-20 MG/5ML PO SUSP: 30.0000 mL | ORAL | Status: DC | PRN

## 2016-12-07 MED ORDER — SERTRALINE HCL 100 MG PO TABS
200.0000 mg | ORAL_TABLET | Freq: Every day | ORAL | Status: DC
  Administered 2016-12-08: 200 mg via ORAL
  Filled 2016-12-07: qty 2

## 2016-12-07 NOTE — ED Notes (Signed)
Patient ate 100% of breakfast and beverage.  

## 2016-12-07 NOTE — [facility] Suicide Risk Assessment (Signed)
[facility]   Nursing information obtained from:    Demographic factors:    Current Mental Status:    Loss Factors:    Historical Factors:    Risk Reduction Factors:     Total Time spent with patient: 1 hour Principal Problem: Severe recurrent major depression without psychotic features [facility](HCC) Diagnosis:   Patient Active Problem List   Diagnosis Date Noted  . Severe recurrent major depression without psychotic features (HCC) [F33.2] 12/06/2016  . Hypertension [I10] 12/06/2016   Subjective Data: suicidal ideation.  Continued Clinical Symptoms:    The "Alcohol Use Disorders Identification Test", Guidelines for Use in Primary Care, Second Edition.  World Science writerHealth Organization [facility](WHO). Score between 0-7:  no or low risk or alcohol related problems. Score between 8-15:  moderate risk of alcohol related problems. Score between 16-19:  high risk of alcohol related problems. Score 20 or above:  warrants further diagnostic evaluation for alcohol dependence and treatment.   CLINICAL FACTORS:   Depression:   Insomnia   Musculoskeletal: Strength & Muscle Tone: within normal limits Gait & Station: normal Patient leans: N/A  Psychiatric Specialty Exam: Physical Exam  Nursing note and vitals reviewed. Psychiatric: Her speech is normal and behavior is normal. Thought content normal. Her mood appears anxious. Cognition and memory are normal. She expresses impulsivity. She exhibits a depressed mood.    Review of Systems  Psychiatric/Behavioral: Positive for depression and suicidal ideas. The patient has insomnia.   All other systems reviewed and are negative.   Blood pressure (!) 138/95, pulse 78, temperature 98 F (36.7 C), temperature source Oral, resp. rate 18, height 5\' 2"  (1.575 m), weight 72.6 kg (160 lb), SpO2 98 %.Body mass index is 29.26 kg/m.  General Appearance: Casual  Eye Contact:  Good  Speech:  Clear and Coherent  Volume:  Normal  Mood:  Anxious   Affect:  Appropriate  Thought Process:  Goal Directed and Descriptions of Associations: Intact  Orientation:  Full (Time, Place, and Person)  Thought Content:  WDL  Suicidal Thoughts:  No  Homicidal Thoughts:  No  Memory:  Immediate;   Fair Recent;   Fair Remote;   Fair  Judgement:  Impaired  Insight:  Present  Psychomotor Activity:  Normal  Concentration:  Concentration: Fair and Attention Span: Fair  Recall:  [employer]  Fund of Knowledge:  Fair  Language:  Fair  Akathisia:  No  Handed:  Right  AIMS (if indicated):     Assets:  Communication Skills Desire for Improvement Financial Resources/Insurance Housing Physical Health Resilience Social Support Transportation Vocational/Educational  ADL's:  Intact  Cognition:  WNL  Sleep:         COGNITIVE FEATURES THAT CONTRIBUTE TO RISK:  None    SUICIDE RISK:   Minimal: No identifiable suicidal ideation.  Patients presenting with no risk factors but with morbid ruminations; may be classified as minimal risk based on the severity of the depressive symptoms  PLAN OF CARE: Hospital admission, medication management, discharge planning.  Melissa Duncan is a 51 year old female with a history of depression and anxiety admitted for suicidal ideation in the context of marital conflict.  1. Suicidal ideation. The patient adamantly denies any thoughts, intentions, or plans to hurt herself or others. She is able to contract for safety in the hospital.  2. Mood. She did well in the past on a combination of Zoloft and Abilify. We will restart both medications.  3. Hypertension. We will continue Maxide.  4. GERD.  Pepcid and Carafate are available.  5. Insomnia. Trazodone is available.  6. Metabolic syndrome monitoring. Lipid panel TSH and hemoglobin A1c are pending.  7. EKG. Pending.  8. Pregnancy. She had hysterectomy.  9. Disposition. She will be discharged to home with family. She will follow up with Dr. Maryruth Bun and her therapist.     I certify that inpatient services furnished can reasonably be expected to improve the patient's condition.   Kristine Linea, MD 12/07/2016, 5:02 PM

## 2016-12-07 NOTE — ED Notes (Signed)
[facility] [place], Nurse will escort Patient downstairs with law enforcement also.

## 2016-12-07 NOTE — BH Assessment (Signed)
[facility]. Toni Amendlapacs.  Attending Physician will be Dr. Jennet MaduroPucilowska.   Patient [facility] 315[facility] HallsGwen F.   Intake Paper Work has been signed and placed on patient chart.  ER staff is aware of the admission (April, ER Sect.; Dr. Mayford KnifeWilliams, ER MD; Rudy JewWendy L., Patient's Nurse & Ivin BootyJoshua, Patient Access).

## 2016-12-07 NOTE — Progress Notes (Signed)
Patient has been med compliant today. She has attended groups. She denies SI, HI, AVH. She does complain of tooth pain and given tylenol for that. And a Band-Aid  For a blister on her foot.

## 2016-12-07 NOTE — ED Notes (Signed)
Nurse gave Patient her breakfast, she is alert and oriented, denies Si/hi or avh, she states that she feels better and wishes she could go home, she without any behavioral issues, will continue to monitor, q 15 minute checks and camera surveillance in progress for safety.

## 2016-12-07 NOTE — ED Notes (Signed)
Patient is alert and oriented, teary eyed, states that he husband shocked her and cheated on her with one of His co-workers, and that she was having a hard time coping, but she knows now that she does not want to die, she wants to live, and move near her mom in [place]. Patient is worried about her job at [employer] that she has worked at for 23 years because she has missed a lot of work but hopes that she will be able to keep her job, Patient denies Si/hi or avh, nurse talked to her about hope, and strength, and coping skills, Patient q 15 minute checks and camera surveillance in progress for safety.

## 2016-12-07 NOTE — Consult Note (Signed)
[facility] Psychiatry Consult   Reason for Consult:  Consult for 51 year old woman with a history of depression who was petitioned by her primary psychiatrist today Referring Physician:  Siadecki Patient Identification: Melissa Duncan MRN:  [id number] Principal Diagnosis: Severe recurrent major depression without psychotic features [facility]) Diagnosis:   Patient Active Problem List   Diagnosis Date Noted  . Severe recurrent major depression without psychotic features ([place]) [F33.2] 12/06/2016  . Hypertension [I10] 12/06/2016    Total Time spent with patient: 15 minutes  Subjective:   Melissa Duncan is a 51 y.o. female patient admitted with "Dr. Nicolasa Ducking thinks I'm suicidal".  Follow-up this 51 year old woman with severe depression. Patient had no behavior problems today. Continues to be depressed. Passive suicidal ideation. Flat affect. No new physical complaints. Cooperative with treatment.  HPI:  Patient interviewed chart reviewed. 51 year old woman with a history of recurrent major depression. Depression has been worse for the last couple months. Gradually getting worse.. Feels hopeless and down most of the time. No longer able to work. Frequent crying spells. Admits that she is sleeping very poorly at night with frequent wakening. Not eating or taking care of herself well. She has been noncompliant with her prescribed antidepressant medicine. Endorses suicidal thoughts although denies any specific plan. Denies psychosis. Not abusing alcohol or drugs. Major stress is that her husband has left her for another woman.  Social history: Currently staying partially by herself although it sounds like she has some other relatives at times intervening to help her out. Her second husband has left her for another woman. 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  Medical history: High blood pressure. What she calls "borderline  diabetes".  Substance abuse history: Denies alcohol or drug abuse current or past  Past Psychiatric History: Patient has been diagnosed with depression and treated in the past with good success with Zoloft and Abilify. More recently Abilify had been supposedly losing its effectiveness. Patient has tried other medications including Rexulti. She denies currently having any psychotic symptoms. She admits that she has been noncompliant with medicine saying that it is just too much trouble to go pick up the medicines from the pharmacy.  Risk to Self: Is patient at risk for suicide?: Yes Risk to Others:   Prior Inpatient Therapy:   Prior Outpatient Therapy:    Past Medical History:  Past Medical History:  Diagnosis Date  . Diabetes mellitus without complication ([place])   . Hypertension     Past Surgical History:  Procedure Laterality Date  . ABDOMINAL HYSTERECTOMY    . BREAST BIOPSY Right 2015   neg   Family History:  Family History  Problem Relation Age of Onset  . Adopted: Yes   Family Psychiatric  History: She does not know if she is adopted Social History:  History  Alcohol Use No     History  Drug Use No    Social History   Social History  . Marital status: Married    Spouse name: N/A  . Number of children: N/A  . Years of education: N/A   Social History Main Topics  . Smoking status: Never Smoker  . Smokeless tobacco: Never Used  . Alcohol use No  . Drug use: No  . Sexual activity: Not Asked   Other Topics Concern  . None   Social History Narrative  . None   Additional Social History:    Allergies:   Allergies  Allergen Reactions  . Ibuprofen Other (See Comments)    Eye swelling  . Wellbutrin [Bupropion] Rash    Labs:  Results for orders placed or performed during the hospital encounter of 12/06/16 (from the past 48 hour(s))  Comprehensive metabolic panel     Status: Abnormal   Collection Time: 12/06/16  6:31 PM  Result Value Ref Range   Sodium  140 135 - 145 mmol/L   Potassium 3.8 3.5 - 5.1 mmol/L   Chloride 107 101 - 111 mmol/L   CO2 24 22 - 32 mmol/L   Glucose, Bld 97 65 - 99 mg/dL   BUN 15 6 - 20 mg/dL   Creatinine, Ser 0.73 0.44 - 1.00 mg/dL   Calcium 9.5 8.9 - 10.3 mg/dL   Total Protein 7.8 6.5 - 8.1 g/dL   Albumin 4.3 3.5 - 5.0 g/dL   AST 29 15 - 41 U/L   ALT 33 14 - 54 U/L   Alkaline Phosphatase 73 38 - 126 U/L   Total Bilirubin 1.3 (H) 0.3 - 1.2 mg/dL   GFR calc non Af Amer >60 >60 mL/min   GFR calc Af Amer >60 >60 mL/min    Comment: (NOTE) The eGFR has been calculated using the CKD EPI equation. This calculation has not been validated in all clinical situations. eGFR's persistently <60 mL/min signify possible Chronic Kidney Disease.    Anion gap 9 5 - 15  Ethanol     Status: None   Collection Time: 12/06/16  6:31 PM  Result Value Ref Range   Alcohol, Ethyl (B) <5 <5 mg/dL    Comment:        LOWEST DETECTABLE LIMIT FOR SERUM ALCOHOL IS 5 mg/dL FOR MEDICAL PURPOSES ONLY   Salicylate level     Status: None   Collection Time: 12/06/16  6:31 PM  Result Value Ref Range   Salicylate Lvl <6.8 2.8 - 30.0 mg/dL  Acetaminophen level     Status: Abnormal   Collection Time: 12/06/16  6:31 PM  Result Value Ref Range   Acetaminophen (Tylenol), Serum <10 (L) 10 - 30 ug/mL    Comment:        THERAPEUTIC CONCENTRATIONS VARY SIGNIFICANTLY. A RANGE OF 10-30 ug/mL MAY BE AN EFFECTIVE CONCENTRATION FOR MANY PATIENTS. HOWEVER, SOME ARE BEST TREATED AT CONCENTRATIONS OUTSIDE THIS RANGE. ACETAMINOPHEN CONCENTRATIONS >150 ug/mL AT 4 HOURS AFTER INGESTION AND >50 ug/mL AT 12 HOURS AFTER INGESTION ARE OFTEN ASSOCIATED WITH TOXIC REACTIONS.   cbc     Status: None   Collection Time: 12/06/16  6:31 PM  Result Value Ref Range   WBC 11.0 3.6 - 11.0 K/uL   RBC 5.01 3.80 - 5.20 MIL/uL   Hemoglobin 14.6 12.0 - 16.0 g/dL   HCT 42.4 35.0 - 47.0 %   MCV 84.6 80.0 - 100.0 fL   MCH 29.2 26.0 - 34.0 pg   MCHC 34.5 32.0 - 36.0  g/dL   RDW 14.3 11.5 - 14.5 %   Platelets 264 150 - 440 K/uL    Current Facility-Administered Medications  Medication Dose Route Frequency Provider Last Rate Last Dose  . acetaminophen (TYLENOL) tablet 650 mg  650 mg Oral Q6H PRN Katalina Magri T, MD      . alum & mag hydroxide-simeth (MAALOX/MYLANTA) 200-200-20 MG/5ML suspension 30 mL  30 mL Oral Q4H PRN Hallel Denherder T, MD      . ARIPiprazole (ABILIFY) tablet 5 mg  5 mg Oral Daily Kristalynn Coddington, Madie Reno, MD      .  famotidine (PEPCID) tablet 20 mg  20 mg Oral BID Anaya Bovee, Madie Reno, MD   20 mg at 12/07/16 1755  . magnesium hydroxide (MILK OF MAGNESIA) suspension 30 mL  30 mL Oral Daily PRN Meggen Spaziani T, MD      . sertraline (ZOLOFT) tablet 200 mg  200 mg Oral Daily Lavontae Cornia T, MD      . sucralfate (CARAFATE) tablet 1 g  1 g Oral TID WC & HS Wilman Tucker, Madie Reno, MD   1 g at 12/07/16 1755  . traZODone (DESYREL) tablet 100 mg  100 mg Oral QHS PRN Dannya Pitkin T, MD      . triamterene-hydrochlorothiazide (MAXZIDE-25) 37.5-25 MG per tablet 0.5 tablet  0.5 tablet Oral Daily Zephan Beauchaine, Madie Reno, MD        Musculoskeletal: Strength & Muscle Tone: within normal limits Gait & Station: normal Patient leans: N/A  Psychiatric Specialty Exam: Physical Exam  Nursing note and vitals reviewed. Constitutional: She appears well-developed and well-nourished.  HENT:  Head: Normocephalic and atraumatic.  Eyes: Pupils are equal, round, and reactive to light. Conjunctivae are normal.  Neck: Normal range of motion.  Cardiovascular: Regular rhythm and normal heart sounds.   Respiratory: Effort normal. No respiratory distress.  GI: Soft.  Musculoskeletal: Normal range of motion.  Neurological: She is alert.  Skin: Skin is warm and dry.  Psychiatric: Judgment normal. Her affect is blunt. Her speech is delayed. She is slowed. Cognition and memory are normal. She expresses suicidal ideation.    Review of Systems  Constitutional: Negative.   HENT: Negative.    Eyes: Negative.   Respiratory: Negative.   Cardiovascular: Negative.   Gastrointestinal: Negative.   Musculoskeletal: Negative.   Skin: Negative.   Neurological: Negative.   Psychiatric/Behavioral: Positive for depression and suicidal ideas. Negative for hallucinations, memory loss and substance abuse. The patient is nervous/anxious and has insomnia.     Blood pressure (!) 138/95, pulse 78, temperature 98 F (36.7 C), temperature source Oral, resp. rate 18, height _0  (1.575 m), weight 72.6 kg (160 lb), SpO2 98 %.Body mass index is 29.26 kg/m.  General Appearance: Casual  Eye Contact:  Minimal  Speech:  Slow  Volume:  Decreased  Mood:  Depressed  Affect:  Congruent  Thought Process:  Goal Directed  Orientation:  Full (Time, Place, and Person)  Thought Content:  Logical  Suicidal Thoughts:  Yes.  without intent/plan  Homicidal Thoughts:  No  Memory:  Immediate;   Good Recent;   Fair Remote;   Fair  Judgement:  Impaired  Insight:  Fair  Psychomotor Activity:  Decreased  Concentration:  Concentration: Fair  Recall:  [employer] of Knowledge:  Fair  Language:  Fair  Akathisia:  No  Handed:  Right  AIMS (if indicated):     Assets:  Communication Skills Desire for Improvement Housing Physical Health Social Support  ADL's:  Intact  Cognition:  WNL  Sleep:        Treatment Plan Summary: Daily contact with patient to assess and evaluate symptoms and progress in treatment, Medication management and Plan Patient continues to be appropriate for admission to the hospital. Orders have been in place and she is to be transferred downstairs will be admitted to the behavioral health unit. Inpatient psychiatrist aware.  Disposition: Recommend psychiatric Inpatient admission when medically cleared. Supportive therapy provided about ongoing stressors.  Alethia Berthold, MD 12/07/2016 6:35 PM

## 2016-12-07 NOTE — ED Notes (Signed)
Patient transferring downstairs stairs, [facility] for treatment, dad came to visit prior to transfer, she gave him her purse and cell phone to take with him back home, patient is pleasant, all Patient's clothes taken with her, she transferred via w/c with nurse and police custody, as she is IVC'd.

## 2016-12-07 NOTE — H&P (Signed)
Psychiatric Admission Assessment Adult  Patient Identification: Melissa Duncan MRN:  [id number] Date of Evaluation:  12/07/2016 Chief Complaint:  depression Principal Diagnosis: Severe recurrent major depression without psychotic features [facility]) Diagnosis:   Patient Active Problem List   Diagnosis Date Noted  . Severe recurrent major depression without psychotic features ([place]) [F33.2] 12/06/2016  . Hypertension [I10] 12/06/2016   History of Present Illness:   Identifying data. Melissa Duncan is a 51 year old female with history of depression.  Chief complaint. "Dr. Nicolasa Ducking felt I was a danger to myself."  History of present illness. Information was obtained from the patient and the chart and from the conversation with her primary psychiatrist, Dr. Nicolasa Ducking. The patient has been increasingly depressed over the past month or so after she realized that her husband has been unfaithful. Over the holiday weekend she realized that he moved in with another woman and became overwhelmed. Apparently she has been depressed to the point that she could hardly get out of bed and was not interested in taking medications. She was on FLMA for couple of weeks in August, return to work briefly, and was unable to work last week after her husband left.The patient reports many symptoms of depression with extremely poor sleep, decreased appetite and 17, weight loss, anhedonia, feeling of guilt and hopelessness worthlessness, poor energy and concentration, social isolation, crying spells, and passing thoughts of suicide. She did not have a plan or did not attempt to hurt herself. When upset and in despair, she went to confront her husband's mistress which created problems at work.  There is disciplinary action taken against her. She denies psychotic symptoms or symptoms suggestive of bipolar mania. She reports heightened anxiety. She does not use alcohol or drugs.  The patient is very well composed during the interview and only  slightly tearful. She has made many important decisions and took action to secure her property. She opened a new bank account and the money there and the husband has been spending money excessively on his mistress. She change her locks in the house. She started looking for an apartment in [place] area where she will be moving shortly. She started taking steps to transfer her job to [place]. She is proactive in her disciplinary case seeking help from the unions. She would like to sell the house that is really hers but during refinancing sheet with her husband's name on the deed.  Past psychiatric history. The patient has a long history of depression at least since 2010 with her first husband committed suicide. She has never been hospitalized or attempted suicide. She has been maintained on Zoloft for depression. Abilify was used on and off for augmentation. Recently record result he was admitted to her regimen with little improvement even though it is unclear if the patient took it. She stopped taking her medication and cannot really having a long ago.  Family psychiatric history. Unknown. The patient was adopted.  Social history. 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 This is her second marriage. She has been married to her second husband for 7 years. Last week he moved out of the house. The patient has support from her father who is currently in town, her mother and her sister who live in [place] but are on the way here, her sister-in-law and a close friend. Unfortunately there is a disciplinary action at work. The patient will miss the meeting with her superiors.  Total Time spent with patient: 1 hour  Is the patient at risk to self? No.  Has the patient been a risk to self in the past 6 months? No.  Has the patient been a risk to self within the distant past? No.  Is the patient a risk to others? No.  Has the patient been a risk to others in the past 6  months? No.  Has the patient been a risk to others within the distant past? No.   Prior Inpatient Therapy:   Prior Outpatient Therapy:    Alcohol Screening:   Substance Abuse History in the last 12 months:  No. Consequences of Substance Abuse: NA Previous Psychotropic Medications: Yes  Psychological Evaluations: No  Past Medical History:  Past Medical History:  Diagnosis Date  . Diabetes mellitus without complication ([place])   . Hypertension     Past Surgical History:  Procedure Laterality Date  . ABDOMINAL HYSTERECTOMY    . BREAST BIOPSY Right 2015   neg   Family History:  Family History  Problem Relation Age of Onset  . Adopted: Yes   Tobacco Screening:   Social History:  History  Alcohol Use No     History  Drug Use No    Additional Social History:                           Allergies:   Allergies  Allergen Reactions  . Ibuprofen Other (See Comments)    Eye swelling  . Wellbutrin [Bupropion] Rash   Lab Results:  Results for orders placed or performed during the hospital encounter of 12/06/16 (from the past 48 hour(s))  Comprehensive metabolic panel     Status: Abnormal   Collection Time: 12/06/16  6:31 PM  Result Value Ref Range   Sodium 140 135 - 145 mmol/L   Potassium 3.8 3.5 - 5.1 mmol/L   Chloride 107 101 - 111 mmol/L   CO2 24 22 - 32 mmol/L   Glucose, Bld 97 65 - 99 mg/dL   BUN 15 6 - 20 mg/dL   Creatinine, Ser 0.73 0.44 - 1.00 mg/dL   Calcium 9.5 8.9 - 10.3 mg/dL   Total Protein 7.8 6.5 - 8.1 g/dL   Albumin 4.3 3.5 - 5.0 g/dL   AST 29 15 - 41 U/L   ALT 33 14 - 54 U/L   Alkaline Phosphatase 73 38 - 126 U/L   Total Bilirubin 1.3 (H) 0.3 - 1.2 mg/dL   GFR calc non Af Amer >60 >60 mL/min   GFR calc Af Amer >60 >60 mL/min    Comment: (NOTE) The eGFR has been calculated using the CKD EPI equation. This calculation has not been validated in all clinical situations. eGFR's persistently <60 mL/min signify possible Chronic  Kidney Disease.    Anion gap 9 5 - 15  Ethanol     Status: None   Collection Time: 12/06/16  6:31 PM  Result Value Ref Range   Alcohol, Ethyl (B) <5 <5 mg/dL    Comment:        LOWEST DETECTABLE LIMIT FOR SERUM ALCOHOL IS 5 mg/dL FOR MEDICAL PURPOSES ONLY   Salicylate level     Status: None   Collection Time: 12/06/16  6:31 PM  Result Value Ref Range   Salicylate Lvl <7.3 2.8 - 30.0 mg/dL  Acetaminophen level     Status: Abnormal   Collection Time: 12/06/16  6:31 PM  Result Value Ref Range   Acetaminophen (Tylenol), Serum <10 (L) 10 -  30 ug/mL    Comment:        THERAPEUTIC CONCENTRATIONS VARY SIGNIFICANTLY. A RANGE OF 10-30 ug/mL MAY BE AN EFFECTIVE CONCENTRATION FOR MANY PATIENTS. HOWEVER, SOME ARE BEST TREATED AT CONCENTRATIONS OUTSIDE THIS RANGE. ACETAMINOPHEN CONCENTRATIONS >150 ug/mL AT 4 HOURS AFTER INGESTION AND >50 ug/mL AT 12 HOURS AFTER INGESTION ARE OFTEN ASSOCIATED WITH TOXIC REACTIONS.   cbc     Status: None   Collection Time: 12/06/16  6:31 PM  Result Value Ref Range   WBC 11.0 3.6 - 11.0 K/uL   RBC 5.01 3.80 - 5.20 MIL/uL   Hemoglobin 14.6 12.0 - 16.0 g/dL   HCT 42.4 35.0 - 47.0 %   MCV 84.6 80.0 - 100.0 fL   MCH 29.2 26.0 - 34.0 pg   MCHC 34.5 32.0 - 36.0 g/dL   RDW 14.3 11.5 - 14.5 %   Platelets 264 150 - 440 K/uL    Blood Alcohol level:  Lab Results  Component Value Date   ETH <5 56/38/7564    Metabolic Disorder Labs:  No results found for: HGBA1C, MPG No results found for: PROLACTIN No results found for: CHOL, TRIG, HDL, CHOLHDL, VLDL, LDLCALC  Current Medications: Current Facility-Administered Medications  Medication Dose Route Frequency Provider Last Rate Last Dose  . acetaminophen (TYLENOL) tablet 650 mg  650 mg Oral Q6H PRN Clapacs, John T, MD      . alum & mag hydroxide-simeth (MAALOX/MYLANTA) 200-200-20 MG/5ML suspension 30 mL  30 mL Oral Q4H PRN Clapacs, John T, MD      . ARIPiprazole (ABILIFY) tablet 5 mg  5 mg Oral Daily  Clapacs, John T, MD      . famotidine (PEPCID) tablet 20 mg  20 mg Oral BID Clapacs, John T, MD      . magnesium hydroxide (MILK OF MAGNESIA) suspension 30 mL  30 mL Oral Daily PRN Clapacs, John T, MD      . sertraline (ZOLOFT) tablet 200 mg  200 mg Oral Daily Clapacs, John T, MD      . sucralfate (CARAFATE) tablet 1 g  1 g Oral TID WC & HS Clapacs, John T, MD      . traZODone (DESYREL) tablet 100 mg  100 mg Oral QHS PRN Clapacs, John T, MD      . triamterene-hydrochlorothiazide (MAXZIDE-25) 37.5-25 MG per tablet 0.5 tablet  0.5 tablet Oral Daily Clapacs, Madie Reno, MD       PTA Medications: Prescriptions Prior to Admission  Medication Sig Dispense Refill Last Dose  . ARIPiprazole (ABILIFY) 5 MG tablet Take 2.5-5 mg by mouth See admin instructions. Take 2.5 mg by mouth for 5 days, then take 5 mg by mouth once daily.  0 Past Week at Unknown time  . irbesartan-hydrochlorothiazide (AVALIDE) 150-12.5 MG tablet Take 1 tablet by mouth daily.  0 Past Week at Unknown time  . ranitidine (ZANTAC) 150 MG capsule Take 1 capsule (150 mg total) by mouth 2 (two) times daily. (Patient not taking: Reported on 12/06/2016) 28 capsule 0 Not Taking at Unknown time  . sertraline (ZOLOFT) 100 MG tablet Take 2 tablets by mouth daily.  0 Past Week at Unknown time  . sucralfate (CARAFATE) 1 G tablet Take 1 tablet (1 g total) by mouth 4 (four) times daily. (Patient not taking: Reported on 12/06/2016) 120 tablet 1 Not Taking at Unknown time    Musculoskeletal: Strength & Muscle Tone: within normal limits Gait & Station: normal Patient leans: N/A  Psychiatric Specialty Exam: I  reviewed physical examination performed in the emergency room and agree with the findings. Physical Exam  Nursing note and vitals reviewed. Psychiatric: Her speech is normal and behavior is normal. Thought content normal. Her affect is blunt. Cognition and memory are normal. She expresses impulsivity. She exhibits a depressed mood.    Review of  Systems  Psychiatric/Behavioral: Positive for depression and suicidal ideas. The patient has insomnia.   All other systems reviewed and are negative.   Blood pressure (!) 138/95, pulse 78, temperature 98 F (36.7 C), temperature source Oral, resp. rate 18, height '5\' 2"'  (1.575 m), weight 72.6 kg (160 lb), SpO2 98 %.Body mass index is 29.26 kg/m.  See SRA.                                                  Sleep:       Treatment Plan Summary: Daily contact with patient to assess and evaluate symptoms and progress in treatment and Medication management   Melissa Duncan is a 51 year old female with a history of depression and anxiety admitted for suicidal ideation in the context of marital conflict.  1. Suicidal ideation. The patient adamantly denies any thoughts, intentions, or plans to hurt herself or others. She is able to contract for safety in the hospital.  2. Mood. She did well in the past on a combination of Zoloft and Abilify. We will restart both medications.  3. Hypertension. We will continue Maxide.  4. GERD. Pepcid and Carafate are available.  5. Insomnia. Trazodone is available.  6. Metabolic syndrome monitoring. Lipid panel TSH and hemoglobin A1c are pending.  7. EKG. Pending.  8. Pregnancy. She had hysterectomy.  9. Disposition. She will be discharged to home with family. She will follow up with Dr. Nicolasa Ducking and her therapist.    Observation Level/Precautions:  15 minute checks  Laboratory:  CBC Chemistry Profile UDS UA  Psychotherapy:    Medications:    Consultations:    Discharge Concerns:    Estimated LOS:  Other:     Physician Treatment Plan for Primary Diagnosis: Severe recurrent major depression without psychotic features ([place]) Long Term Goal(s): Improvement in symptoms so as ready for discharge  Short Term Goals: Ability to identify changes in lifestyle to reduce recurrence of condition will improve, Ability to verbalize  feelings will improve, Ability to disclose and discuss suicidal ideas, Ability to demonstrate self-control will improve, Ability to identify and develop effective coping behaviors will improve, Ability to maintain clinical measurements within normal limits will improve, Compliance with prescribed medications will improve and Ability to identify triggers associated with substance abuse/mental health issues will improve  Physician Treatment Plan for Secondary Diagnosis: Principal Problem:   Severe recurrent major depression without psychotic features ([place]) Active Problems:   Hypertension  Long Term Goal(s): NA  Short Term Goals: NA  I certify that inpatient services furnished can reasonably be expected to improve the patient's condition.    Orson Slick, MD 9/11/20185:09 PM

## 2016-12-07 NOTE — [facility] Group Notes (Signed)
[facility] Group Notes:  (Nursing/MHT/Case Management/Adjunct)  Date:  12/07/2016  Time:  9:33 PM  Type of Therapy:  Group Therapy  Participation Level:  Active  Participation Quality:  Appropriate  Affect:  Appropriate  Cognitive:  Alert  Insight:  Good  Engagement in Group:  Engaged  Modes of Intervention:  Support  Summary of Progress/Problems:  Melissa Duncan L Shamarr Faucett 12/07/2016, 9:33 PM 

## 2016-12-08 LAB — HEMOGLOBIN A1C
Hgb A1c MFr Bld: 6.3 % — ABNORMAL HIGH (ref 4.8–5.6)
Mean Plasma Glucose: 134.11 mg/dL

## 2016-12-08 LAB — LIPID PANEL
CHOL/HDL RATIO: 5.1 ratio
Cholesterol: 198 mg/dL (ref 0–200)
HDL: 39 mg/dL — ABNORMAL LOW (ref >40)
LDL CALC: 106 mg/dL — AB (ref 0–99)
Triglycerides: 265 mg/dL — ABNORMAL HIGH (ref <150)
VLDL: 53 mg/dL — AB (ref 0–40)

## 2016-12-08 LAB — TSH: TSH: 1.257 u[IU]/mL (ref 0.350–4.500)

## 2016-12-08 MED ORDER — TRAZODONE HCL 100 MG PO TABS: 100.0000 mg | ORAL_TABLET | Freq: Every evening | ORAL | 1 refills | Status: DC | PRN

## 2016-12-08 MED ORDER — SUCRALFATE 1 G PO TABS: 1.0000 g | ORAL_TABLET | Freq: Four times a day (QID) | ORAL | 1 refills | Status: DC

## 2016-12-08 MED ORDER — IRBESARTAN-HYDROCHLOROTHIAZIDE 150-12.5 MG PO TABS: 1.0000 | ORAL_TABLET | Freq: Every day | ORAL | 1 refills | Status: AC

## 2016-12-08 MED ORDER — ARIPIPRAZOLE 5 MG PO TABS: 5.0000 mg | ORAL_TABLET | Freq: Every day | ORAL | 1 refills | Status: DC

## 2016-12-08 MED ORDER — FAMOTIDINE 20 MG PO TABS: 20.0000 mg | ORAL_TABLET | Freq: Two times a day (BID) | ORAL | 1 refills | Status: DC

## 2016-12-08 MED ORDER — SERTRALINE HCL 100 MG PO TABS: 200.0000 mg | ORAL_TABLET | Freq: Every day | ORAL | 1 refills | Status: AC

## 2016-12-08 NOTE — Progress Notes (Signed)
D: Pt denies SI/HI and contracts for safety during the shift. Pt is pleasant and cooperative, affect is flat, but brightens upon approach. Patient rated depression a 5/10 on a scale of (0-10). Patient thought process is organized, appears less anxious and she is interacting with peers and staff appropriately.  A: Pt was offered support and encouragement. Pt was offered scheduled medications. Pt was encouraged to attend groups. Q 15 minute checks were done for safety.  R:Pt attends groups and interacts well with peers and staff. Pt refused nightime medication. Patient is receptive to treatment and safety maintained on unit.

## 2016-12-08 NOTE — [facility] Group Notes (Signed)
[facility] Group Notes:  (Nursing/MHT/Case Management/Adjunct)  Date:  12/08/2016  Time:  2:25 PM  Type of Therapy:  Psychoeducational Skills  Participation Level:  Active  Participation Quality:  Appropriate, Attentive and Sharing  Affect:  Appropriate  Cognitive:  Appropriate  Insight:  Good  Engagement in Group:  Engaged  Modes of Intervention:  Discussion and Education  Summary of Progress/Problems:  Emunah Texidor A Nirali Magouirk 12/08/2016, 2:25 PM

## 2016-12-08 NOTE — [facility] Suicide Risk Assessment (Signed)
[facility] INPATIENT:  Family/Significant Other Suicide Prevention Education  Suicide Prevention Education:  Education Completed;[place] Gener[place]omas Copland (father) [phone] has been identified by the patient as the family member/significant other with whom the patient will be residing, and identified as the person(s) who will aid the patient in the event of a mental health crisis (suicidal ideations/suicide attempt).  With written consent from the patient, the family member/significant other has been provided the following suicide prevention education, prior to the and/or following the discharge of the patient.  The suicide prevention education provided includes the following:  Suicide risk factors  Suicide prevention and interventions  National Suicide Hotline telephone number  [facility] assessment telephone number  [facility] Assistance 911  [facility] and/or [facility] Crisis [facility] telephone number  Request made of family/significant other to:  Remove weapons (e.g., guns, rifles, knives), all items previously/currently identified as safety concern.    Remove drugs/medications (over-the-counter, prescriptions, illicit drugs), all items previously/currently identified as a safety concern.  The family member/significant other verbalizes understanding of the suicide prevention education information provided.  The family member/significant other agrees to remove the items of safety concern listed above.  Daisy Floroandace L Brandom Kerwin MSW, LCSW  12/08/2016, 12:44 PM

## 2016-12-08 NOTE — Progress Notes (Signed)
Patient discharged home. DC instructions provided and explained. Medications reviewed. Rx given. All questions answered. Denies SI, HI, AVH. Belongings returned. Transition, risk and avs given to patient.

## 2016-12-08 NOTE — Tx Team (Signed)
Interdisciplinary Treatment and Diagnostic Plan Update  12/08/2016 Time of Session: 10:30am  [name] MRN: [id number]  Principal Diagnosis: Severe recurrent major depression without psychotic features [facility](HCC)  Secondary Diagnoses: Principal Problem:   Severe recurrent major depression without psychotic features (HCC) Active Problems:   Hypertension   Current Medications:  Current Facility-Administered Medications  Medication Dose Route Frequency Provider Last Rate Last Dose  . acetaminophen (TYLENOL) tablet 650 mg  650 mg Oral Q6H PRN Clapacs, John T, MD      . alum & mag hydroxide-simeth (MAALOX/MYLANTA) 200-200-20 MG/5ML suspension 30 mL  30 mL Oral Q4H PRN Clapacs, John T, MD      . ARIPiprazole (ABILIFY) tablet 5 mg  5 mg Oral Daily Clapacs, Jackquline DenmarkJohn T, MD   5 mg at 12/08/16 0745  . famotidine (PEPCID) tablet 20 mg  20 mg Oral BID Clapacs, Jackquline DenmarkJohn T, MD   20 mg at 12/08/16 0745  . magnesium hydroxide (MILK OF MAGNESIA) suspension 30 mL  30 mL Oral Daily PRN Clapacs, John T, MD      . sertraline (ZOLOFT) tablet 200 mg  200 mg Oral Daily Clapacs, John T, MD   200 mg at 12/08/16 0745  . sucralfate (CARAFATE) tablet 1 g  1 g Oral TID WC & HS Clapacs, John T, MD   1 g at 12/08/16 1210  . traZODone (DESYREL) tablet 100 mg  100 mg Oral QHS PRN Clapacs, Jackquline DenmarkJohn T, MD      . triamterene-hydrochlorothiazide (MAXZIDE-25) 37.5-25 MG per tablet 0.5 tablet  0.5 tablet Oral Daily Clapacs, Jackquline DenmarkJohn T, MD   0.5 tablet at 12/08/16 0745   PTA Medications: Prescriptions Prior to Admission  Medication Sig Dispense Refill Last Dose  . ARIPiprazole (ABILIFY) 5 MG tablet Take 2.5-5 mg by mouth See admin instructions. Take 2.5 mg by mouth for 5 days, then take 5 mg by mouth once daily.  0 Past Week at Unknown time  . ranitidine (ZANTAC) 150 MG capsule Take 1 capsule (150 mg total) by mouth 2 (two) times daily. (Patient not taking: Reported on 12/06/2016) 28 capsule 0 Not Taking at Unknown time  . [DISCONTINUED]  irbesartan-hydrochlorothiazide (AVALIDE) 150-12.5 MG tablet Take 1 tablet by mouth daily.  0 Past Week at Unknown time  . [DISCONTINUED] sertraline (ZOLOFT) 100 MG tablet Take 2 tablets by mouth daily.  0 Past Week at Unknown time  . [DISCONTINUED] sucralfate (CARAFATE) 1 G tablet Take 1 tablet (1 g total) by mouth 4 (four) times daily. (Patient not taking: Reported on 12/06/2016) 120 tablet 1 Not Taking at Unknown time    Patient Stressors:    Patient Strengths:    Treatment Modalities: Medication Management, Group therapy, Case management,  1 to 1 session with clinician, Psychoeducation, Recreational therapy.   Physician Treatment Plan for Primary Diagnosis: Severe recurrent major depression without psychotic features (HCC) Long Term Goal(s): Improvement in symptoms so as ready for discharge NA   Short Term Goals: Ability to identify changes in lifestyle to reduce recurrence of condition will improve Ability to verbalize feelings will improve Ability to disclose and discuss suicidal ideas Ability to demonstrate self-control will improve Ability to identify and develop effective coping behaviors will improve Ability to maintain clinical measurements within normal limits will improve Compliance with prescribed medications will improve Ability to identify triggers associated with substance abuse/mental health issues will improve NA  Medication Management: Evaluate patient's response, side effects, and tolerance of medication regimen.  Therapeutic Interventions: 1 to 1 sessions, Unit Group sessions  and Medication administration.  Evaluation of Outcomes: Adequate for Discharge  Physician Treatment Plan for Secondary Diagnosis: Principal Problem:   Severe recurrent major depression without psychotic features (HCC) Active Problems:   Hypertension  Long Term Goal(s): Improvement in symptoms so as ready for discharge NA   Short Term Goals: Ability to identify changes in lifestyle to  reduce recurrence of condition will improve Ability to verbalize feelings will improve Ability to disclose and discuss suicidal ideas Ability to demonstrate self-control will improve Ability to identify and develop effective coping behaviors will improve Ability to maintain clinical measurements within normal limits will improve Compliance with prescribed medications will improve Ability to identify triggers associated with substance abuse/mental health issues will improve NA     Medication Management: Evaluate patient's response, side effects, and tolerance of medication regimen.  Therapeutic Interventions: 1 to 1 sessions, Unit Group sessions and Medication administration.  Evaluation of Outcomes: Adequate for Discharge   RN Treatment Plan for Primary Diagnosis: Severe recurrent major depression without psychotic features (HCC) Long Term Goal(s): Knowledge of disease and therapeutic regimen to maintain health will improve  Short Term Goals: Ability to remain free from injury will improve, Ability to verbalize frustration and anger appropriately will improve, Ability to demonstrate self-control, Ability to identify and develop effective coping behaviors will improve and Compliance with prescribed medications will improve  Medication Management: RN will administer medications as ordered by provider, will assess and evaluate patient's response and provide education to patient for prescribed medication. RN will report any adverse and/or side effects to prescribing provider.  Therapeutic Interventions: 1 on 1 counseling sessions, Psychoeducation, Medication administration, Evaluate responses to treatment, Monitor vital signs and CBGs as ordered, Perform/monitor CIWA, COWS, AIMS and Fall Risk screenings as ordered, Perform wound care treatments as ordered.  Evaluation of Outcomes: Adequate for Discharge   LCSW Treatment Plan for Primary Diagnosis: Severe recurrent major depression without  psychotic features (HCC) Long Term Goal(s): Safe transition to appropriate next level of care at discharge, Engage patient in therapeutic group addressing interpersonal concerns.  Short Term Goals: Engage patient in aftercare planning with referrals and resources, Increase social support, Increase ability to appropriately verbalize feelings, Increase emotional regulation, Identify triggers associated with mental health/substance abuse issues and Increase skills for wellness and recovery  Therapeutic Interventions: Assess for all discharge needs, 1 to 1 time with Social worker, Explore available resources and support systems, Assess for adequacy in community support network, Educate family and significant other(s) on suicide prevention, Complete Psychosocial Assessment, Interpersonal group therapy.  Evaluation of Outcomes: Adequate for Discharge   Progress in Treatment: Attending groups: Yes. Participating in groups: Yes. Taking medication as prescribed: Yes. Toleration medication: Yes. Family/Significant other contact made: Yes, individual(s) contacted:  father  Patient understands diagnosis: Yes. Discussing patient identified problems/goals with staff: Yes. Medical problems stabilized or resolved: Yes. Denies suicidal/homicidal ideation: Yes. Issues/concerns per patient self-inventory: No. Other: NA   New problem(s) identified: No, Describe:  NA  New Short Term/Long Term Goal(s):  Discharge Plan or Barriers:   Reason for Continuation of Hospitalization: Depression Medication stabilization Suicidal ideation  Estimated Length of Stay: 9/12  Attendees: Patient: Melissa Duncan  12/08/2016 2:36 PM   Dr. Jennet Maduro  12/08/2016 2:36 PM  Nursing: Jamesetta So RN  12/08/2016 2:36 PM  RN Care Manager: 12/08/2016 2:36 PM  Social Worker: Rondall Allegra, LCSW 12/08/2016 2:36 PM  Recreational Therapist:  12/08/2016 2:36 PM  Other:  12/08/2016 2:36 PM  Other:  12/08/2016 2:36 PM  Other: 12/08/2016  2:36 PM    Scribe for Treatment Team: Rondall Allegra, [place] 12/08/2016 2:36 PM

## 2016-12-08 NOTE — Discharge Summary (Signed)
Physician Discharge Summary Note  Patient:  Melissa Duncan is an 51 y.o., female MRN:  [id number] DOB:  20-Apr-1965 Patient phone:  [phone] (home)  Patient address:   [address] [place] [address],  Total Time spent with patient: 30 minutes  Date of Admission:  12/07/2016 Date of Discharge: 12/08/2016  Reason for Admission:  Suicidal ideation.  Identifying data. Ms. Melissa Duncan is a 51 year old female with history of depression.  Chief complaint. "Dr. Maryruth Bun felt I was a danger to myself."  History of present illness. Information was obtained from the patient and the chart and from the conversation with her primary psychiatrist, Dr. Maryruth Bun. The patient has been increasingly depressed over the past month or so after she realized that her husband has been unfaithful. Over the holiday weekend she realized that he moved in with another woman and became overwhelmed. Apparently she has been depressed to the point that she could hardly get out of bed and was not interested in taking medications. She was on FLMA for couple of weeks in August, return to work briefly, and was unable to work last week after her husband left.The patient reports many symptoms of depression with extremely poor sleep, decreased appetite and 17, weight loss, anhedonia, feeling of guilt and hopelessness worthlessness, poor energy and concentration, social isolation, crying spells, and passing thoughts of suicide. She did not have a plan or did not attempt to hurt herself. When upset and in despair, she went to confront her husband's mistress which created problems at work.  There is disciplinary action taken against her. She denies psychotic symptoms or symptoms suggestive of bipolar mania. She reports heightened anxiety. She does not use alcohol or drugs.  The patient is very well composed during the interview and only slightly tearful. She has made many important decisions and took action to secure her property. She  opened a new bank account and the money there and the husband has been spending money excessively on his mistress. She change her locks in the house. She started looking for an apartment in [place] area where she will be moving shortly. She started taking steps to transfer her job to [employer]. She is proactive in her disciplinary case seeking help from the unions. She would like to sell the house that is really hers but during refinancing sheet with her husband's name on the deed.  Past psychiatric history. The patient has a long history of depression at least since 2010 with her first husband committed suicide. She has never been hospitalized or attempted suicide. She has been maintained on Zoloft for depression. Abilify was used on and off for augmentation. Recently record result he was admitted to her regimen with little improvement even though it is unclear if the patient took it. She stopped taking her medication and cannot really having a long ago.  Family psychiatric history. Unknown. The patient was adopted.  Social history. 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 This is her second marriage. She has been married to her second husband for 7 years. Last week he moved out of the house. The patient has support from her father who is currently in town, her mother and her sister who live in [place] but are on the way here, her sister-in-law and a close friend. Unfortunately there is a disciplinary action at work. The patient will miss the meeting with her superiors.  Principal Problem: Severe recurrent major depression without psychotic features [facility]) Discharge Diagnoses: Patient  Active Problem List   Diagnosis Date Noted  . Severe recurrent major depression without psychotic features (HCC) [F33.2] 12/06/2016  . Hypertension [I10] 12/06/2016    Past Medical History:  Past Medical History:  Diagnosis Date  . Diabetes mellitus without complication (HCC)    . Hypertension     Past Surgical History:  Procedure Laterality Date  . ABDOMINAL HYSTERECTOMY    . BREAST BIOPSY Right 2015   neg   Family History:  Family History  Problem Relation Age of Onset  . Adopted: Yes   Social History:  History  Alcohol Use No     History  Drug Use No    Social History   Social History  . Marital status: Married    Spouse name: N/A  . Number of children: N/A  . Years of education: N/A   Social History Main Topics  . Smoking status: Never Smoker  . Smokeless tobacco: Never Used  . Alcohol use No  . Drug use: No  . Sexual activity: Not Asked   Other Topics Concern  . None   Social History Narrative  . None    Hospital Course:    Ms. Melissa DredgeLangley is a 51 year old female with a history of depression and anxiety admitted for suicidal ideation in the context of marital conflict.  1. Suicidal ideation. The patient adamantly denies any thoughts, intentions, or plans to hurt herself or others. She is able to contract for safety. She is forward thinking and optimistic about the future. She is another daughter and sister.  2. Mood. She was restarted on a combination of Zoloft and Abilify for depression.  3. Hypertension. We continued Avalide.   4. GERD. Pepcid and Carafate were available.  5. Insomnia. Trazodone was available.  6. Metabolic syndrome monitoring. Lipid panel and TSH are normal, hemoglobin A1c 6.3.   7.EKG. Normal sinus rhythm, QTc 425.  8. Pregnancy. She had hysterectomy.  9. Disposition. The patient is on FMLA until October. She was discharged to home with family. She will follow up with Dr. Maryruth BunKapur and her therapist.   Physical Findings: AIMS:  , ,  ,  ,    CIWA:    COWS:     Musculoskeletal: Strength & Muscle Tone: within normal limits Gait & Station: normal Patient leans: N/A  Psychiatric Specialty Exam: Physical Exam  Nursing note and vitals reviewed. Psychiatric: She has a normal mood and affect.  Her speech is normal and behavior is normal. Thought content normal. Cognition and memory are normal.    Review of Systems  Psychiatric/Behavioral: Positive for depression.  All other systems reviewed and are negative.   Blood pressure 126/84, pulse 76, temperature 98 F (36.7 C), temperature source Oral, resp. rate 18, height 5\' 2"  (1.575 m), weight 72.6 kg (160 lb), SpO2 99 %.Body mass index is 29.26 kg/m.  General Appearance: Casual  Eye Contact:  Good  Speech:  Clear and Coherent  Volume:  Normal  Mood:  Anxious  Affect:  Appropriate  Thought Process:  Goal Directed and Descriptions of Associations: Intact  Orientation:  Full (Time, Place, and Person)  Thought Content:  WDL  Suicidal Thoughts:  No  Homicidal Thoughts:  No  Memory:  Immediate;   Fair Recent;   Fair Remote;   Fair  Judgement:  Impaired  Insight:  Present  Psychomotor Activity:  Normal  Concentration:  Concentration: Fair and Attention Span: Fair  Recall:  [employer]  Fund of Knowledge:  Fair  Language:  Fair  Akathisia:  No  Handed:  Right  AIMS (if indicated):     Assets:  Communication Skills Desire for Improvement Financial Resources/Insurance Housing Physical Health Resilience Social Support Transportation Vocational/Educational  ADL's:  Intact  Cognition:  WNL  Sleep:  Number of Hours: 7.5     Have you used any form of tobacco in the last 30 days? (Cigarettes, Smokeless Tobacco, Cigars, and/or Pipes): No  Has this patient used any form of tobacco in the last 30 days? (Cigarettes, Smokeless Tobacco, Cigars, and/or Pipes) Yes, No  Blood Alcohol level:  Lab Results  Component Value Date   ETH <5 12/06/2016    Metabolic Disorder Labs:  Lab Results  Component Value Date   HGBA1C 6.3 (H) 12/08/2016   MPG 134.11 12/08/2016   No results found for: PROLACTIN Lab Results  Component Value Date   CHOL 198 12/08/2016   TRIG 265 (H) 12/08/2016   HDL 39 (L) 12/08/2016   CHOLHDL 5.1 12/08/2016    VLDL 53 (H) 12/08/2016   LDLCALC 106 (H) 12/08/2016    See Psychiatric Specialty Exam and Suicide Risk Assessment completed by Attending Physician prior to discharge.  Discharge destination:  Home  Is patient on multiple antipsychotic therapies at discharge:  No   Has Patient had three or more failed trials of antipsychotic monotherapy by history:  No  Recommended Plan for Multiple Antipsychotic Therapies: NA  Discharge Instructions    Diet - low sodium heart healthy    Complete by:  As directed    Increase activity slowly    Complete by:  As directed      Allergies as of 12/08/2016      Reactions   Ibuprofen Other (See Comments)   Eye swelling   Wellbutrin [bupropion] Rash      Medication List    STOP taking these medications   ranitidine 150 MG capsule Commonly known as:  ZANTAC     TAKE these medications     Indication  ARIPiprazole 5 MG tablet Commonly known as:  ABILIFY Take 1 tablet (5 mg total) by mouth daily. What changed:  how much to take  when to take this  additional instructions  Indication:  Major Depressive Disorder   famotidine 20 MG tablet Commonly known as:  PEPCID Take 1 tablet (20 mg total) by mouth 2 (two) times daily.  Indication:  Gastroesophageal Reflux Disease   irbesartan-hydrochlorothiazide 150-12.5 MG tablet Commonly known as:  AVALIDE Take 1 tablet by mouth daily.  Indication:  High Blood Pressure Disorder   sertraline 100 MG tablet Commonly known as:  ZOLOFT Take 2 tablets (200 mg total) by mouth daily.  Indication:  Major Depressive Disorder   sucralfate 1 g tablet Commonly known as:  CARAFATE Take 1 tablet (1 g total) by mouth 4 (four) times daily.  Indication:  Ulcer of the Duodenum   traZODone 100 MG tablet Commonly known as:  DESYREL Take 1 tablet (100 mg total) by mouth at bedtime as needed for sleep.  Indication:  Trouble Sleeping        Follow-up recommendations:  Activity:  As tolerated. Diet:  Low  sodium heart healthy. Other:  Keep follow-up appointments.  Comments:    Signed: Kristine Linea, MD 12/08/2016, 12:03 PM

## 2016-12-08 NOTE — BHH Counselor (Signed)
PSA was not completed. Pt discharged within 24 hours.   Daisy FloroCandace L Carmelle Bamberg MSW, LCSW  12/08/2016 1:03 PM

## 2016-12-08 NOTE — [facility] Suicide Risk Assessment (Signed)
[facility]   Principal Problem: Severe recurrent major depression without psychotic features [facility](HCC) Discharge Diagnoses:  Patient Active Problem List   Diagnosis Date Noted  . Severe recurrent major depression without psychotic features (HCC) [F33.2] 12/06/2016  . Hypertension [I10] 12/06/2016    Total Time spent with patient: 30 minutes  Musculoskeletal: Strength & Muscle Tone: within normal limits Gait & Station: normal Patient leans: N/A  Psychiatric Specialty Exam: Review of Systems  Psychiatric/Behavioral: Positive for depression.  All other systems reviewed and are negative.   Blood pressure 126/84, pulse 76, temperature 98 F (36.7 C), temperature source Oral, resp. rate 18, height 5\' 2"  (1.575 m), weight 72.6 kg (160 lb), SpO2 99 %.Body mass index is 29.26 kg/m.  General Appearance: Casual  Eye Contact::  Good  Speech:  Clear and Coherent409  Volume:  Normal  Mood:  Anxious  Affect:  Appropriate  Thought Process:  Goal Directed and Descriptions of Associations: Intact  Orientation:  Full (Time, Place, and Person)  Thought Content:  WDL  Suicidal Thoughts:  No  Homicidal Thoughts:  No  Memory:  Immediate;   Fair Recent;   Fair Remote;   Fair  Judgement:  Impaired  Insight:  Present  Psychomotor Activity:  Normal  Concentration:  Fair  Recall:  Fis[employer]Fair  Fund of Knowledge:Fair  Language: Fair  Akathisia:  No  Handed:  Right  AIMS (if indicated):     Assets:  Communication Skills Desire for Improvement Financial Resources/Insurance Housing Physical Health Resilience Social Support Transportation Vocational/Educational  Sleep:  Number of Hours: 7.5  Cognition: WNL  ADL's:  Intact   Mental Status Per Nursing Assessment::   On Admission:     Demographic Factors:  Living alone  Loss Factors: Loss of significant relationship and Financial problems/change in socioeconomic status  Historical Factors: Impulsivity  Risk  Reduction Factors:   Sense of responsibility to family, Employed, Positive social support and Positive therapeutic relationship  Continued Clinical Symptoms:  Depression:   Impulsivity Insomnia  Cognitive Features That Contribute To Risk:  None    Suicide Risk:  Minimal: No identifiable suicidal ideation.  Patients presenting with no risk factors but with morbid ruminations; may be classified as minimal risk based on the severity of the depressive symptoms    Plan Of Care/Follow-up recommendations:  Activity:  As tolerated. Diet:  Low sodium heart healthy. Other:  Keep follow-up appointments.  Kristine LineaJolanta Pucilowska, MD 12/08/2016, 12:00 PM

## 2017-12-23 ENCOUNTER — Other Ambulatory Visit: Payer: Self-pay | Admitting: Family Medicine

## 2017-12-23 DIAGNOSIS — Z1231 Encounter for screening mammogram for malignant neoplasm of breast: Secondary | ICD-10-CM

## 2018-01-13 ENCOUNTER — Ambulatory Visit
Admission: RE | Admit: 2018-01-13 | Discharge: 2018-01-13 | Disposition: A | Discharge status: Home / Self Care | Payer: Federal, State, Local not specified - PPO | Source: Ambulatory Visit | Attending: Family Medicine | Admitting: Family Medicine

## 2018-01-13 DIAGNOSIS — Z1231 Encounter for screening mammogram for malignant neoplasm of breast: Secondary | ICD-10-CM | POA: Diagnosis present

## 2019-02-20 ENCOUNTER — Other Ambulatory Visit: Payer: Self-pay | Admitting: Family Medicine

## 2019-02-20 DIAGNOSIS — Z1231 Encounter for screening mammogram for malignant neoplasm of breast: Secondary | ICD-10-CM

## 2019-02-28 ENCOUNTER — Ambulatory Visit
Admission: RE | Admit: 2019-02-28 | Discharge: 2019-02-28 | Disposition: A | Discharge status: Home / Self Care | Payer: Federal, State, Local not specified - PPO | Source: Ambulatory Visit | Attending: Family Medicine | Admitting: Family Medicine

## 2019-02-28 DIAGNOSIS — Z1231 Encounter for screening mammogram for malignant neoplasm of breast: Secondary | ICD-10-CM | POA: Diagnosis not present

## 2019-03-06 ENCOUNTER — Encounter: Payer: Federal, State, Local not specified - PPO | Attending: Family Medicine | Admitting: *Deleted

## 2019-03-06 ENCOUNTER — Other Ambulatory Visit: Payer: Self-pay

## 2019-03-06 ENCOUNTER — Encounter: Payer: Self-pay | Admitting: *Deleted

## 2019-03-06 VITALS — BP 150/96 | Ht 62.0 in | Wt 172.5 lb

## 2019-03-06 DIAGNOSIS — E119 Type 2 diabetes mellitus without complications: Secondary | ICD-10-CM

## 2019-03-06 DIAGNOSIS — I10 Essential (primary) hypertension: Secondary | ICD-10-CM | POA: Insufficient documentation

## 2019-03-06 DIAGNOSIS — Z713 Dietary counseling and surveillance: Secondary | ICD-10-CM | POA: Diagnosis present

## 2019-03-06 DIAGNOSIS — E663 Overweight: Secondary | ICD-10-CM | POA: Insufficient documentation

## 2019-03-06 DIAGNOSIS — Z6831 Body mass index (BMI) 31.0-31.9, adult: Secondary | ICD-10-CM | POA: Insufficient documentation

## 2019-03-06 NOTE — Patient Instructions (Signed)
Check blood sugars 2 x day before breakfast and 2 hrs after one meal every day Bring blood sugar records to MD appointment  Exercise: Begin walking for 10-15  minutes  3 days a week and gradually increase to 30 minutes 5 x week  Eat 3 meals day, 1-2 snacks a day Space meals 4-6 hours apart Don't skip meals Avoid sugar sweetened drinks (soda, tea)  Complete 3 Day Food Record and bring to next appt with the dietitian  Call for any questions

## 2019-03-07 NOTE — Progress Notes (Signed)
Diabetes Self-Management Education  Visit Type: First/Initial  Appt. Start Time: 1535 Appt. End Time: 5009  03/06/2019  Ms. [employer], identified by name and date of birth, is a 53 y.o. female with a diagnosis of Diabetes: Type 2.   ASSESSMENT  Blood pressure (!) 150/96, height 5\' 2"  (1.575 m), weight 172 lb 8 oz (78.2 kg). Body mass index is 31.55 kg/m.  Diabetes Self-Management Education - 03/06/19 1655      Visit Information   Visit Type  First/Initial      Initial Visit   Diabetes Type  Type 2    Are you currently following a meal plan?  No    Are you taking your medications as prescribed?  Yes    Date Diagnosed  2015      Health Coping   How would you rate your overall health?  Poor      Psychosocial Assessment   Patient Belief/Attitude about Diabetes  Motivated to manage diabetes    Self-care barriers  None    Self-management support  Doctor's office;Family    Patient Concerns  Nutrition/Meal planning;Glycemic Control;Medication;Monitoring;Weight Control;Healthy Lifestyle    Special Needs  None    Preferred Learning Style  Hands on    Learning Readiness  Change in progress    How often do you need to have someone help you when you read instructions, pamphlets, or other written materials from your doctor or pharmacy?  1 - Never    What is the last grade level you completed in school?  college 4 hrs      Pre-Education Assessment   Patient understands the diabetes disease and treatment process.  Needs Instruction    Patient understands incorporating nutritional management into lifestyle.  Needs Instruction    Patient undertands incorporating physical activity into lifestyle.  Needs Instruction    Patient understands using medications safely.  Needs Instruction    Patient understands monitoring blood glucose, interpreting and using results  Needs Review    Patient understands prevention, detection, and treatment of acute complications.  Needs Instruction    Patient understands prevention, detection, and treatment of chronic complications.  Needs Instruction    Patient understands how to develop strategies to address psychosocial issues.  Needs Instruction    Patient understands how to develop strategies to promote health/change behavior.  Needs Instruction      Complications   Last HgB A1C per patient/outside source  8.5 %   02/07/2019   How often do you check your blood sugar?  1-2 times/day    Fasting Blood glucose range (mg/dL)  70-129;130-179   FBG's 97-149 mg/dL   Postprandial Blood glucose range (mg/dL)  70-129;130-179;180-200   pp's 120-190 mg/dL - bedtime readings 133-154 mg/dL.   Have you had a dilated eye exam in the past 12 months?  Yes    Have you had a dental exam in the past 12 months?  Yes    Are you checking your feet?  Yes    How many days per week are you checking your feet?  7      Dietary Intake   Breakfast  skips or has 2 packs instant flavored oatmeal; cereal and milk; sausage, eggs, grits, toast, hashbrown    Snack (morning)  reports no snacks during the day    Lunch  eats out 5/7 days - [employer] - fish filet and fries; [employer] - sandwich and fries; salads    Dinner  chicken, beef, occasional pork, fish; potaotes, bread, occasional corn -  beans-peas; rice, pasta, lettuce, tomatoes, coarrots, onions, cuccumbers, broccoli, brussels sprouts, spinach, green beans    Beverage(s)  water, occasional soda and tea, coffee      Exercise   Exercise Type  ADL's      Patient Education   Previous Diabetes Education  Yes (please comment)   2015 with [place] - came with her spouse   Disease state   Definition of diabetes, type 1 and 2, and the diagnosis of diabetes;Factors that contribute to the development of diabetes    Nutrition management   Role of diet in the treatment of diabetes and the relationship between the three main macronutrients and blood glucose level;Food label reading, portion sizes and measuring food.;Reviewed  blood glucose goals for pre and post meals and how to evaluate the patients' food intake on their blood glucose level.;Information on hints to eating out and maintain blood glucose control.    Physical activity and exercise   Role of exercise on diabetes management, blood pressure control and cardiac health.    Monitoring  Purpose and frequency of SMBG.;Taught/discussed recording of test results and interpretation of SMBG.;Identified appropriate SMBG and/or A1C goals.    Chronic complications  Relationship between chronic complications and blood glucose control    Psychosocial adjustment  Identified and addressed patients feelings and concerns about diabetes      Individualized Goals (developed by patient)   Reducing Risk Improve blood sugars Decrease medications Prevent diabetes complications Lose weight Lead a healthier lifestyle     Outcomes   Expected Outcomes  Demonstrated interest in learning. Expect positive outcomes    Program Status  Not Completed       Individualized Plan for Diabetes Self-Management Training:   Learning Objective:  Patient will have a greater understanding of diabetes self-management. Patient education plan is to attend individual and/or group sessions per assessed needs and concerns.   Plan:   Patient Instructions  Check blood sugars 2 x day before breakfast and 2 hrs after one meal every day Bring blood sugar records to MD appointment Exercise: Begin walking for 10-15  minutes  3 days a week and gradually increase to 30 minutes 5 x week Eat 3 meals day, 1-2 snacks a day Space meals 4-6 hours apart Don't skip meals Avoid sugar sweetened drinks (soda, tea) Complete 3 Day Food Record and bring to next appt with the dietitian  Expected Outcomes:  Demonstrated interest in learning. Expect positive outcomes  Education material provided:  General Meal Planning Guidelines Simple Meal Plan 3 Day Food Record  If problems or questions, patient to contact  team via:  Sharion Settler, RN, CCM, CDE [phone]  Future DSME appointment:  Due to patient's insurance coverage she will not return for diabetes classes but she can be seen by the dietitian in the Medical Nutrition Therapy Program. Her next appointment is March 08, 2019.

## 2019-03-08 ENCOUNTER — Encounter: Payer: Self-pay | Admitting: Dietician

## 2019-03-08 ENCOUNTER — Encounter (INDEPENDENT_AMBULATORY_CARE_PROVIDER_SITE_OTHER): Payer: Federal, State, Local not specified - PPO | Admitting: Dietician

## 2019-03-08 ENCOUNTER — Other Ambulatory Visit: Payer: Self-pay

## 2019-03-08 VITALS — Ht 62.0 in | Wt 170.5 lb

## 2019-03-08 DIAGNOSIS — E119 Type 2 diabetes mellitus without complications: Secondary | ICD-10-CM | POA: Diagnosis not present

## 2019-03-08 NOTE — Progress Notes (Signed)
Medical Nutrition Therapy: Visit start time: 2956  end time: 1745  Assessment:  Diagnosis: Type 2 diabetes Past medical history: HTN, overweight Psychosocial issues/ stress concerns: none  Preferred learning method:  . Hands-on   Current weight: 170.5lbs Height: 5'2" Medications, supplements: reconciled list in medical record  Progress and evaluation:   Patient reports her weight fluctuates between 167-174lbs. Feels bloated at times.   She has worked to reduce sodas and sweet tea.  Her sister lives in the home and does most of the cooking.   Patient completed diabetes visit with RN 2 days ago.     Physical activity: no structured activity at this time; job is mostly sedentary  Dietary Intake:  Usual eating pattern includes 2-3 meals and 1-2 snacks per day. Dining out frequency: 7-10 meals per week.  Breakfast: egg Mcmuffin no meat; 2pkg instant oatmeal; cereal and milk; eggs, sausage, grits/ hash browns/ toast or biscuit; often skips   BG 191 today (egg mcMuffin, sweet tea) Snack: mixed nuts small amount Lunch: fish sandwich with fries; 2 sm hamburger combo; occ chick fila sandwich or grilled nuggets, wrap; blue ribbon shrimp dinner salad Snack: mixed nuts small amount Supper: veg soup with beef; broiled fish + green beans and corn mini cob Snack: 12/9 grapes (BG was 135 at 12:10am, 166 fasting today) Beverages: water, unsweet tea, occasionally coffee  Nutrition Care Education: Topics covered:  Basic nutrition: basic food groups, appropriate nutrient balance, appropriate meal and snack schedule, general nutrition guidelines    Weight control: importance of low sugar and low fat choices, portion control, estimated energy needs for weight loss at 1200kcal, provided guidance for 45%CHO, 25% protein, and 30% fat Diabetes:  appropriate meal and snack schedule, appropriate carb intake and balance, healthy carb choices, role of fiber, protein, fat; role of exercise Hypertension:  identifying high sodium foods, identifying food sources of potassium, magnesium   Nutritional Diagnosis:  [place]-2.2 Altered nutrition-related laboratory As related to Type 2 diabetes.  As evidenced by patient with recent HbA1C of 8.5%. [place]-3.3 Overweight/obesity As related to excess calories and inadequate physical activity.  As evidenced by patient with current BMI of 31.18.  Intervention:   Instruction and discussion as noted above.  Patient has been making positive changes to improve blood sugars and lose weight.   She hopes to reduce the need for medications for hypertension and avoid need for diabetes meds.   Established goals for further dietary change, with direction from patient.   Education Materials given:  . Food lists/ Planning A Balanced Meal . Sample meal pattern/ menus . Daily Food Record worksheets . Goals/ instructions   Learner/ who was taught:  . Patient   Level of understanding: Marland Kitchen Verbalizes/ demonstrates competency   Demonstrated degree of understanding via:   Teach back Learning barriers: . None  Willingness to learn/ readiness for change: . Eager, change in progress   Monitoring and Evaluation:  Dietary intake, exercise, BG control, and body weight      follow up: 04/05/19 at 9:00am

## 2019-03-08 NOTE — Patient Instructions (Signed)
   Plan ahead for healthy, balanced meals -- use "worksheets" to write down meal options. Make sure to include a protein source + some carb with each meal.  Include a vegetable and/or a fruit with each meal. These foods help with blood pressure control and weight control.

## 2019-04-05 ENCOUNTER — Ambulatory Visit: Payer: Federal, State, Local not specified - PPO | Admitting: Dietician

## 2019-04-20 ENCOUNTER — Other Ambulatory Visit: Payer: Self-pay

## 2019-04-20 ENCOUNTER — Encounter: Payer: Federal, State, Local not specified - PPO | Attending: Family Medicine | Admitting: Dietician

## 2019-04-20 ENCOUNTER — Encounter: Payer: Self-pay | Admitting: Dietician

## 2019-04-20 VITALS — Ht 62.0 in | Wt 167.4 lb

## 2019-04-20 DIAGNOSIS — E119 Type 2 diabetes mellitus without complications: Secondary | ICD-10-CM

## 2019-04-20 NOTE — Patient Instructions (Signed)
   Plan out some balanced meals/ menus to help with calorie-control and blood sugars.  Work on ways to improve sleep to see if fasting blood sugars improve. Good sleep also might reduce food cravings.    Continue with managing food portions and working on healthy choices.

## 2019-04-20 NOTE — Progress Notes (Signed)
Medical Nutrition Therapy: Visit start time: 0900  end time: 0940  Assessment:  Diagnosis: type 2 DM Medical history changes: no changes Psychosocial issues/ stress concerns: none  Current weight: 167.4lbs with boots  Height: 5'2" Medications, supplement changes: no changes per patient  Progress and evaluation:  . Patient reports drinking less sodas, and has been drinking tea with splenda, trying to gradually reduce the amount of splenda also.  . She is eating smaller portions overall; feels she still needs to work on balanced meals and better choices. . BG record indicates fasting BGs ranging 129-164, higher readings typically due to eating a sweet snack or dessert the previous night ie girl scout cookies. Post-prandial readings ranging 88-189 + 2 which were >200 in early D[place].  . Patient reports waking up frequently during the night, has not been sleeping well.    Physical activity: increased on-the-job physical activity in past several weeks; walked once during break.   Dietary Intake:  Usual eating pattern includes 3 meals and 1-2 snacks per day. Dining out frequency: not assessed today.  Breakfast: cereal with 1/2 banana; 2pkg oatmeal, (now shares with dog); Egg McMuffin sometimes without meat Snack: small portion nuts +/or apple, grapes Lunch: often salad ([place]) with shrimp; not many burgers or chicken sandwiches recently Snack: occasionally graham crackers with whipped cream Supper: sister usually cooks -- chicken, brussels sprouts, mashed potato with mushroom gravy Snack: occasionally small portion sweet or fruit, spoonful peanut butter Beverages: water, tea with splenda, occasionally coffee  Nutrition Care Education: Topics covered:  Basic nutrition: basic food groups, appropriate nutrient balance Weight control: reviewed progress since previous visit, portion control Advanced nutrition: food label reading Diabetes: goals for BGs, appropriate carb  intake and balance, healthy carb choices for lower glycemic response   Nutritional Diagnosis:  [place]-2.2 Altered nutrition-related laboratory As related to type 2 diabetes.  As evidenced by patient with recent HbA1C of 8.5%. [place]-3.3 Overweight/obesity As related to history of excess calories and inadequate physical actvitiy.  As evidenced by patient with current BMI of 30.6, working on dietary and lifestyle changes to promote ongoing weight loss.  Intervention:  . Instruction and discussion as noted above. . Patient has been making some positive changes, despite increased job stress during holidays.  Marland Kitchen Updated nutrition goals with direction from patient.   Education Materials given:  . Plate Planner with food lists, sample meal pattern . Food lists/ Planning A Balanced Meal . Sample menus . Goals/ instructions   Learner/ who was taught:  . Patient   Level of understanding: Marland Kitchen Verbalizes/ demonstrates competency   Demonstrated degree of understanding via:   Teach back Learning barriers: . None  Willingness to learn/ readiness for change: . Eager, change in progress  Monitoring and Evaluation:  Dietary intake, exercise, BG control, and body weight      follow up: 06/01/19 at 9:00am

## 2019-05-30 ENCOUNTER — Encounter: Payer: Self-pay | Admitting: Dietician

## 2019-05-30 NOTE — Progress Notes (Signed)
Patient cancelled her MNT follow-up appointment scheduled for 06/01/19. We have attempted to reach her to reschedule, left a voicemail message requesting a call back.

## 2019-06-01 ENCOUNTER — Ambulatory Visit: Payer: Federal, State, Local not specified - PPO | Admitting: Dietician

## 2019-06-11 ENCOUNTER — Encounter: Payer: Self-pay | Admitting: Dietician

## 2019-06-11 NOTE — Progress Notes (Signed)
Have not heard back from patient since she cancelled her appointment for 06/01/19, which was the third consecutively cancelled visit. Sent notification to referring provider.

## 2019-06-17 ENCOUNTER — Ambulatory Visit: Payer: Federal, State, Local not specified - PPO | Attending: Internal Medicine

## 2019-06-17 DIAGNOSIS — Z23 Encounter for immunization: Secondary | ICD-10-CM

## 2019-06-17 NOTE — Progress Notes (Signed)
   Covid-19 Vaccination Clinic  Name:  Melissa Duncan    MRN: [id number] DOB: [birth year]  06/17/2019  [name] was observed post Covid-19 immunization for 15 minutes without incident. She was provided with Vaccine Information Sheet and instruction to access the V-Safe system.   [name] was instructed to call 911 with any severe reactions post vaccine: Marland Kitchen Difficulty breathing  . Swelling of face and throat  . A fast heartbeat  . A bad rash all over body  . Dizziness and weakness   Immunizations Administered    Name Date Dose VIS Date Route   Pfizer COVID-19 Vaccine 06/17/2019  9:11 AM 0.3 mL 03/09/2019 Intramuscular   Manufacturer: [employer], [employer]   Lot: [id number]   NDC: [address]-0

## 2019-07-08 ENCOUNTER — Ambulatory Visit: Payer: Federal, State, Local not specified - PPO | Attending: Internal Medicine

## 2019-07-08 DIAGNOSIS — Z23 Encounter for immunization: Secondary | ICD-10-CM

## 2019-07-08 NOTE — Progress Notes (Signed)
   Covid-19 Vaccination Clinic  Name:  Rogue Pautler    MRN: [id number] DOB: [birth year]  07/08/2019  Ms. Diviney was observed post Covid-19 immunization for 15 minutes without incident. She was provided with Vaccine Information Sheet and instruction to access the V-Safe system.   Ms. Dobrowolski was instructed to call 911 with any severe reactions post vaccine: Marland Kitchen Difficulty breathing  . Swelling of face and throat  . A fast heartbeat  . A bad rash all over body  . Dizziness and weakness   Immunizations Administered    Name Date Dose VIS Date Route   Pfizer COVID-19 Vaccine 07/08/2019  9:08 AM 0.3 mL 03/09/2019 Intramuscular   Manufacturer: [employer], [employer]   Lot: [id number]   NDC: [address]-0

## 2020-02-25 ENCOUNTER — Emergency Department: Payer: Federal, State, Local not specified - PPO

## 2020-02-25 ENCOUNTER — Emergency Department
Admission: EM | Admit: 2020-02-25 | Discharge: 2020-02-25 | Disposition: A | Discharge status: Home / Self Care | Payer: Federal, State, Local not specified - PPO | Attending: Emergency Medicine | Admitting: Emergency Medicine

## 2020-02-25 ENCOUNTER — Encounter: Payer: Self-pay | Admitting: Emergency Medicine

## 2020-02-25 ENCOUNTER — Other Ambulatory Visit: Payer: Self-pay

## 2020-02-25 DIAGNOSIS — R42 Dizziness and giddiness: Secondary | ICD-10-CM | POA: Insufficient documentation

## 2020-02-25 DIAGNOSIS — R000 Tachycardia, unspecified: Secondary | ICD-10-CM | POA: Insufficient documentation

## 2020-02-25 DIAGNOSIS — E119 Type 2 diabetes mellitus without complications: Secondary | ICD-10-CM | POA: Diagnosis not present

## 2020-02-25 DIAGNOSIS — Z79899 Other long term (current) drug therapy: Secondary | ICD-10-CM | POA: Diagnosis not present

## 2020-02-25 DIAGNOSIS — Z20822 Contact with and (suspected) exposure to covid-19: Secondary | ICD-10-CM | POA: Insufficient documentation

## 2020-02-25 DIAGNOSIS — I10 Essential (primary) hypertension: Secondary | ICD-10-CM | POA: Diagnosis not present

## 2020-02-25 DIAGNOSIS — R002 Palpitations: Secondary | ICD-10-CM | POA: Diagnosis not present

## 2020-02-25 LAB — CBC
HCT: 47.1 % — ABNORMAL HIGH (ref 36.0–46.0)
Hemoglobin: 16.2 g/dL — ABNORMAL HIGH (ref 12.0–15.0)
MCH: 28.8 pg (ref 26.0–34.0)
MCHC: 34.4 g/dL (ref 30.0–36.0)
MCV: 83.8 fL (ref 80.0–100.0)
Platelets: 320 10*3/uL (ref 150–400)
RBC: 5.62 MIL/uL — ABNORMAL HIGH (ref 3.87–5.11)
RDW: 12.9 % (ref 11.5–15.5)
WBC: 14.3 10*3/uL — ABNORMAL HIGH (ref 4.0–10.5)
nRBC: 0 % (ref 0.0–0.2)

## 2020-02-25 LAB — HEPATIC FUNCTION PANEL
ALT: 16 U/L (ref 0–44)
AST: 21 U/L (ref 15–41)
Albumin: 3.7 g/dL (ref 3.5–5.0)
Alkaline Phosphatase: 101 U/L (ref 38–126)
Bilirubin, Direct: 0.2 mg/dL (ref 0.0–0.2)
Indirect Bilirubin: 1.6 mg/dL — ABNORMAL HIGH (ref 0.3–0.9)
Total Bilirubin: 1.8 mg/dL — ABNORMAL HIGH (ref 0.3–1.2)
Total Protein: 7.3 g/dL (ref 6.5–8.1)

## 2020-02-25 LAB — BASIC METABOLIC PANEL
Anion gap: 15 (ref 5–15)
BUN: 24 mg/dL — ABNORMAL HIGH (ref 6–20)
CO2: 21 mmol/L — ABNORMAL LOW (ref 22–32)
Calcium: 9.1 mg/dL (ref 8.9–10.3)
Chloride: 100 mmol/L (ref 98–111)
Creatinine, Ser: 0.92 mg/dL (ref 0.44–1.00)
GFR, Estimated: >60 mL/min (ref >60)
Glucose, Bld: 170 mg/dL — ABNORMAL HIGH (ref 70–99)
Potassium: 3.7 mmol/L (ref 3.5–5.1)
Sodium: 136 mmol/L (ref 135–145)

## 2020-02-25 LAB — TROPONIN I (HIGH SENSITIVITY)
Troponin I (High Sensitivity): 4 ng/L (ref <18)
Troponin I (High Sensitivity): 4 ng/L (ref <18)

## 2020-02-25 LAB — MAGNESIUM: Magnesium: 2.3 mg/dL (ref 1.7–2.4)

## 2020-02-25 LAB — PROCALCITONIN: Procalcitonin: <0.1 ng/mL

## 2020-02-25 LAB — RESP PANEL BY RT-PCR (FLU A&B, COVID) ARPGX2
Influenza A by PCR: NEGATIVE
Influenza B by PCR: NEGATIVE
SARS Coronavirus 2 by RT PCR: NEGATIVE

## 2020-02-25 LAB — TSH: TSH: 1.29 u[IU]/mL (ref 0.350–4.500)

## 2020-02-25 LAB — BRAIN NATRIURETIC PEPTIDE: B Natriuretic Peptide: 19.6 pg/mL (ref 0.0–100.0)

## 2020-02-25 LAB — D-DIMER, QUANTITATIVE: D-Dimer, Quant: 0.89 ug/mL-FEU — ABNORMAL HIGH (ref 0.00–0.50)

## 2020-02-25 IMAGING — CR DG CHEST 2V
1 series · 2 of 2 positions shown · non-contrast
Comparison: None.

CLINICAL DATA: Chest pain.

EXAM:
CHEST - 2 VIEW

[Series 1: w chest pa · 0.14mm/px · 2 of 2 slices shown]
[im 1/2]
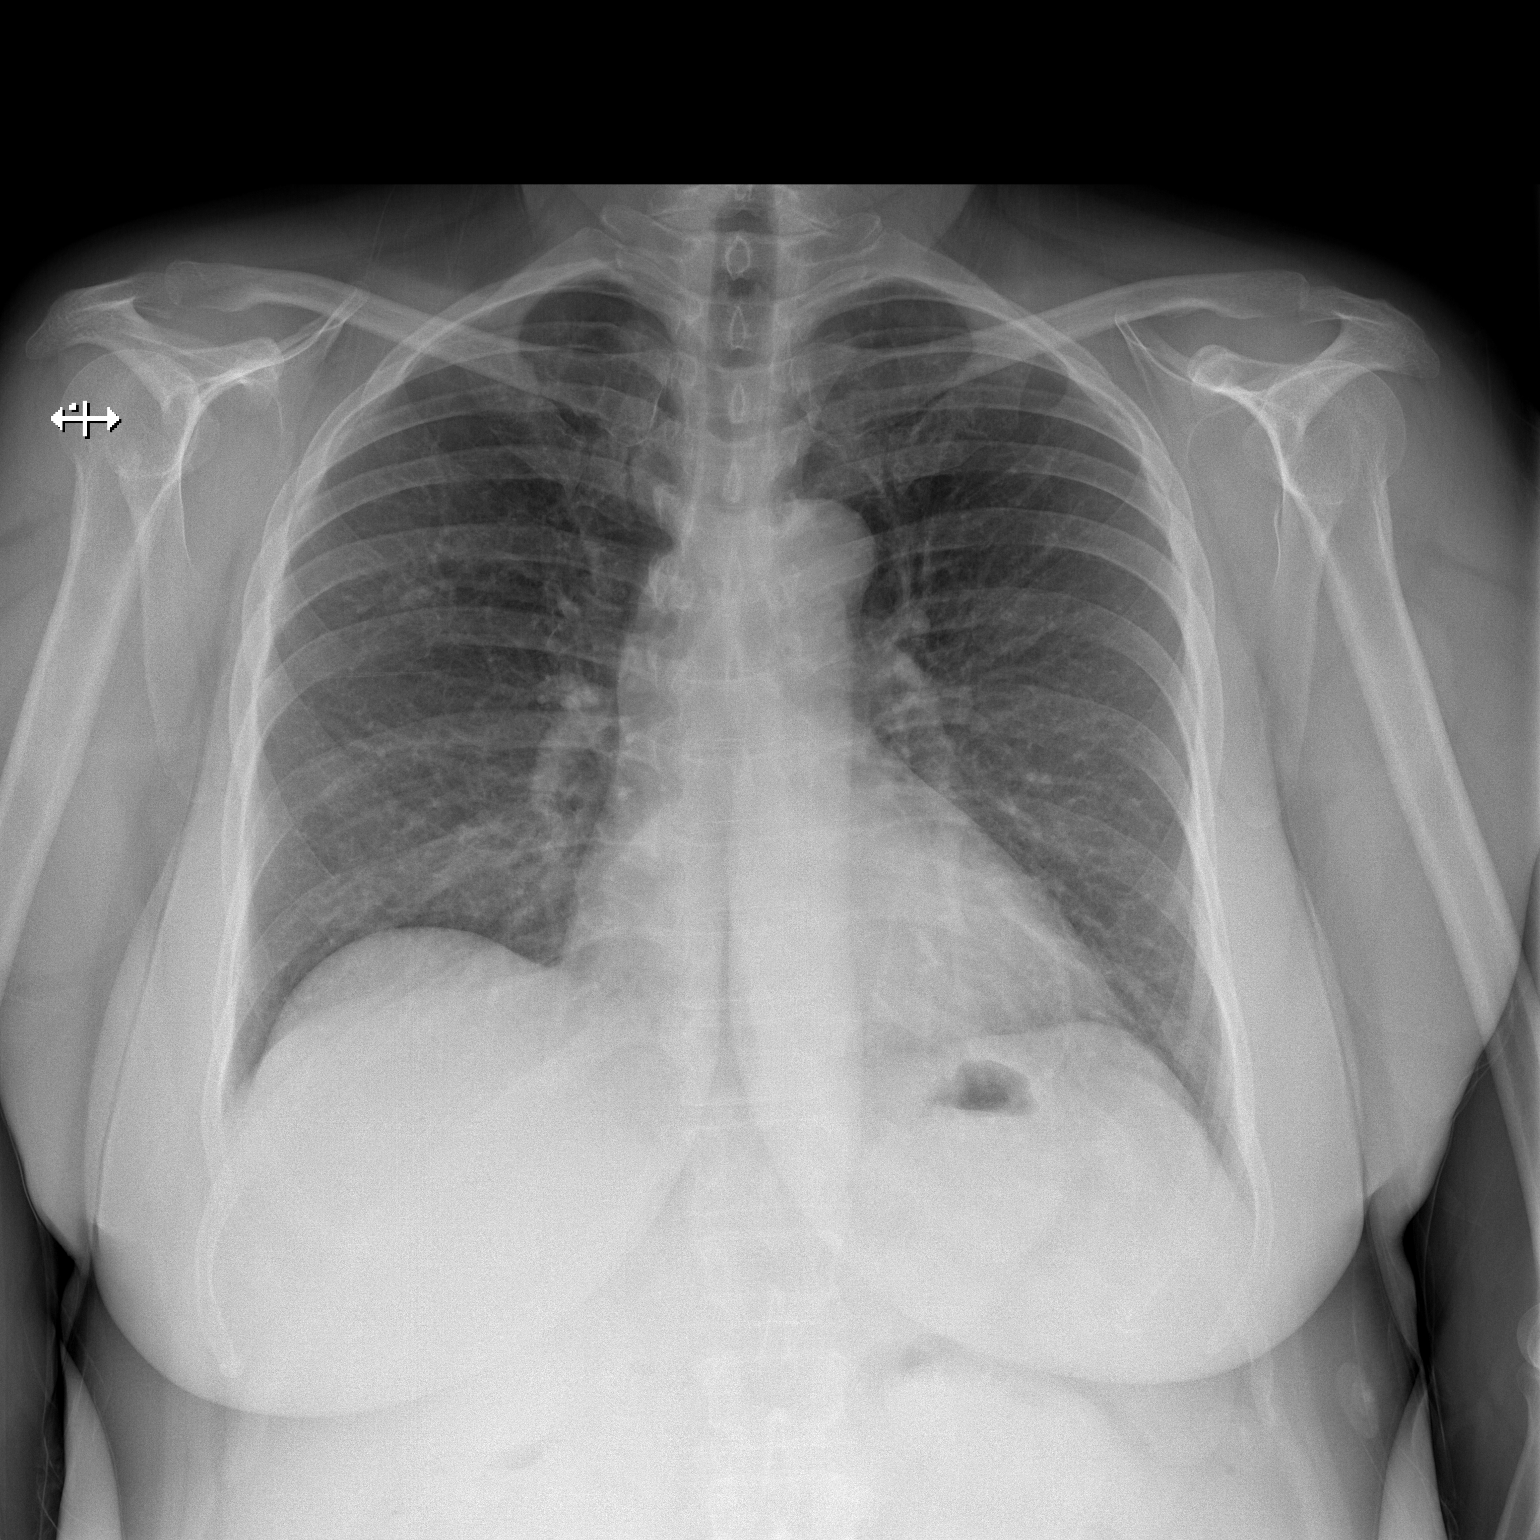
[im 2/2]
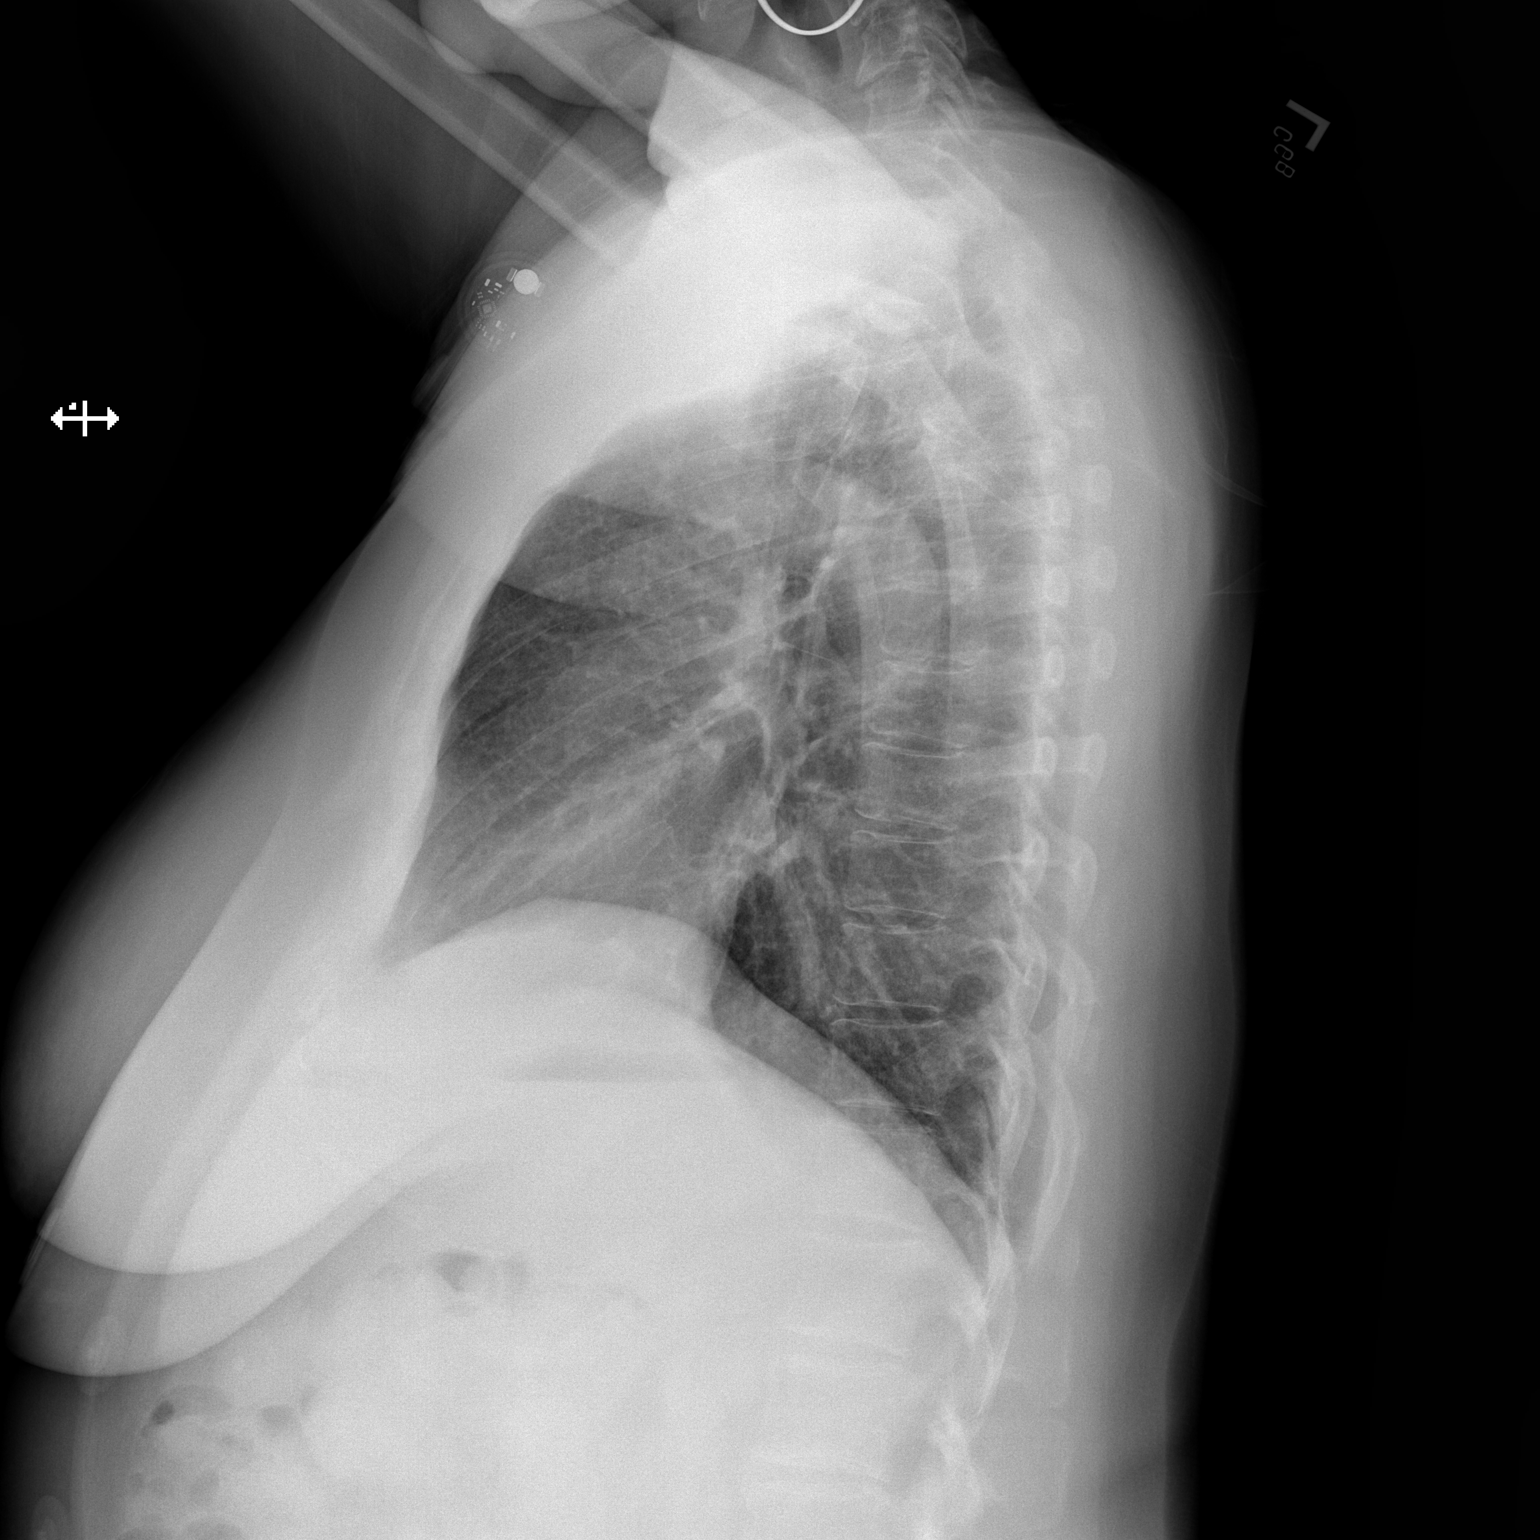

[2 of 2 positions shown; findings below may reference images not displayed]

FINDINGS: The heart size and mediastinal contours are within normal limits.
Both lungs are clear. No pneumothorax or pleural effusion is noted.
The visualized skeletal structures are unremarkable.
IMPRESSION: No active cardiopulmonary disease.

## 2020-02-25 MED ORDER — IOHEXOL 350 MG/ML SOLN
75.0000 mL | Freq: Once | INTRAVENOUS | Status: AC | PRN
  Administered 2020-02-25: 75 mL via INTRAVENOUS

## 2020-02-25 MED ORDER — LACTATED RINGERS IV BOLUS
1000.0000 mL | Freq: Once | INTRAVENOUS | Status: AC
  Administered 2020-02-25: 1000 mL via INTRAVENOUS

## 2020-02-25 NOTE — ED Provider Notes (Signed)
Patient's D-dimer was done at [facility].  It was somewhat elevated.  CT angio was negative except for some slight atelectasis.  EKG did not show any acute changes although the right bundle branch block was new.  Troponins are negative and stable.  I have asked the patient to follow-up with cardiology.  I will have her call in the morning to get an appointment.  She will return if she has any further problems.   Arnaldo Natal, MD 02/25/20 [phone]

## 2020-02-25 NOTE — Discharge Instructions (Addendum)
Studies that we did today in the emergency room are all stable.  Because you are having palpitations and shortness of breath though we want you to follow-up with cardiology.  Dr. Gwen Pounds is on-call tonight.  He is very good.  If you call his office in the morning and let them know that you were in the emergency room with palpitations and shortness of breath they should be able to get you in for follow-up quickly.  Please return here if you get worse again.  If you are having shortness of breath I would call 911.

## 2020-02-25 NOTE — ED Triage Notes (Signed)
Patient to ER for c/o palpitations. Has seen [facility] last week for the same and given medication. Patient states she has been taking medication, but still having palpitations that makes her feel short of breath at times and makes her cough. Also reports fatigue that happens with minimal activity. Patient also states having episode where she feels hot.

## 2020-02-25 NOTE — ED Provider Notes (Signed)
[facility] Emergency Department Provider Note  ____________________________________________   First MD Initiated Contact with Patient 02/25/20 1254     (approximate)  I have reviewed the triage vital signs and the nursing notes.   HISTORY  Chief Complaint Palpitations   HPI Melissa Duncan is a 54 y.o. female with a past medical history of chronic palpitations going at least several months, HTN, DM, depression, and recent diagnosis of sinusitis and bronchitis as well as acute separative otitis media on 11/22 who presents for assessment of several symptoms including several weeks of palpitations associate with some chest pain yesterday and lightheadedness today with patient stating she felt she was going to pass out.  Patient states she take antibiotics as prescribed and feels that the cough congestion sore throat and earache she initially had has vastly improved and she no longer has an earache and her cough is much better.  She states she is primarily worried about the chest pain she had yesterday and feeling that she was in a pass out today.  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  It only last a couple minutes only.  Did not radiate and she has never had any prior pain like that.  States today while working she had palpitations and lightheadedness.  She denies any known CAD or lung pathology.  Denies any tobacco abuse, EtOH abuse, illicit drug use.  No clear alleviating aggravating or precipitating factors.         Past Medical History:  Diagnosis Date  . Depression   . Diabetes mellitus without complication (HCC)   . Hypertension     Patient Active Problem List   Diagnosis Date Noted  . Severe recurrent major depression without psychotic features (HCC) 12/06/2016  . Hypertension 12/06/2016    Past Surgical  History:  Procedure Laterality Date  . ABDOMINAL HYSTERECTOMY    . BREAST BIOPSY Right 2015   neg    Prior to Admission medications   Medication Sig Start Date End Date Taking? Authorizing Provider  Continuous Blood Gluc Receiver (FREESTYLE LIBRE 14 DAY READER) DEVI Use 1 Device as directed 02/14/19   [provider]  irbesartan-hydrochlorothiazide (AVALIDE) 150-12.5 MG tablet Take 1 tablet by mouth daily. 12/08/16   Pucilowska, Jolanta B, MD  Multiple Vitamins-Minerals (ONE-A-DAY 50 PLUS PO) Take 1 tablet by mouth daily.    [provider]  sertraline (ZOLOFT) 100 MG tablet Take 2 tablets (200 mg total) by mouth daily. 12/08/16   Pucilowska, Braulio Conte B, MD  venlafaxine XR (EFFEXOR-XR) 75 MG 24 hr capsule Take 75 mg by mouth daily. 03/31/18   [provider]    Allergies Ibuprofen and Wellbutrin [bupropion]  Family History  Adopted: Yes    Social History Social History   Tobacco Use  . Smoking status: Never Smoker  . Smokeless tobacco: Never Used  Substance Use Topics  . Alcohol use: No  . Drug use: No    Review of Systems  Review of Systems  Constitutional: Negative for chills and fever.  HENT: Positive for congestion. Negative for sore throat.   Eyes: Negative for pain.  Respiratory: Positive for cough. Negative for stridor.   Cardiovascular: Positive for chest pain and palpitations.  Gastrointestinal: Negative for vomiting.  Genitourinary: Negative for dysuria.  Musculoskeletal: Negative for myalgias.  Skin: Negative for rash.  Neurological:  Positive for dizziness. Negative for seizures, loss of consciousness and headaches.  Psychiatric/Behavioral: Negative for suicidal ideas.  All other systems reviewed and are negative.     ____________________________________________   PHYSICAL EXAM:  VITAL SIGNS: ED Triage Vitals  Enc Vitals Group     BP 02/25/20 1120 99/76     Pulse Rate 02/25/20 1120 (!) 102     Resp 02/25/20 1120 18      Temp 02/25/20 1120 (!) 97.4 F (36.3 C)     Temp Source 02/25/20 1120 Oral     SpO2 02/25/20 1120 97 %     Weight 02/25/20 1124 160 lb (72.6 kg)     Height 02/25/20 1124 5\' 2"  (1.575 m)     Head Circumference --      Peak Flow --      Pain Score 02/25/20 1123 0     Pain Loc --      Pain Edu? --      Excl. in [place]? --    Vitals:   02/25/20 1530 02/25/20 1545  BP: 127/85   Pulse: 85 84  Resp: 11 14  Temp:    SpO2:     Physical Exam Vitals and nursing note reviewed.  Constitutional:      General: She is not in acute distress.    Appearance: She is well-developed.  HENT:     Head: Normocephalic and atraumatic.     Right Ear: External ear normal.     Left Ear: External ear normal.     Nose: Nose normal.  Eyes:     Conjunctiva/sclera: Conjunctivae normal.  Cardiovascular:     Rate and Rhythm: Normal rate and regular rhythm.     Heart sounds: No murmur heard.   Pulmonary:     Effort: Pulmonary effort is normal. No respiratory distress.     Breath sounds: Normal breath sounds.  Abdominal:     Palpations: Abdomen is soft.     Tenderness: There is no abdominal tenderness.  Musculoskeletal:     Cervical back: Neck supple.  Skin:    General: Skin is warm and dry.  Neurological:     Mental Status: She is alert and oriented to person, place, and time.      ____________________________________________   LABS (all labs ordered are listed, but only abnormal results are displayed)  Labs Reviewed  BASIC METABOLIC PANEL - Abnormal; Notable for the following components:      Result Value   CO2 21 (*)    Glucose, Bld 170 (*)    BUN 24 (*)    All other components within normal limits  CBC - Abnormal; Notable for the following components:   WBC 14.3 (*)    RBC 5.62 (*)    Hemoglobin 16.2 (*)    HCT 47.1 (*)    All other components within normal limits  HEPATIC FUNCTION PANEL - Abnormal; Notable for the following components:   Total Bilirubin 1.8 (*)    Indirect Bilirubin  1.6 (*)    All other components within normal limits  RESP PANEL BY RT-PCR (FLU A&B, COVID) ARPGX2  BRAIN NATRIURETIC PEPTIDE  MAGNESIUM  TSH  PROCALCITONIN  HEMOGLOBIN A1C  D-DIMER, QUANTITATIVE (NOT AT [facility])  TROPONIN I (HIGH SENSITIVITY)  TROPONIN I (HIGH SENSITIVITY)   ____________________________________________  EKG  Sinus tachycardia with a ventricular rate of 103, normal axis, right bundle branch block, unremarkable intervals otherwise, and some Q waves in lead III and nonspecific changes in anterior leads without  other clear evidence of acute ischemia. ____________________________________________  RADIOLOGY  ED MD interpretation: No large consolidation, overt edema, large effusion, or other acute intrathoracic abnormality.  Official radiology report(s): DG Chest 2 View  Result Date: 02/25/2020 CLINICAL DATA:  Chest pain. EXAM: CHEST - 2 VIEW COMPARISON:  None. FINDINGS: The heart size and mediastinal contours are within normal limits. Both lungs are clear. No pneumothorax or pleural effusion is noted. The visualized skeletal structures are unremarkable. IMPRESSION: No active cardiopulmonary disease. Electronically Signed   By: Lupita Raider M.D.   On: 02/25/2020 12:23    ____________________________________________   PROCEDURES  Procedure(s) performed (including Critical Care):  Procedures   ____________________________________________   INITIAL IMPRESSION / ASSESSMENT AND PLAN / ED COURSE        Patient presents above-stated exam for assessment of some chest pain yesterday and lightheadedness today as well as several weeks of palpitations in the setting of recent diagnosis of sinusitis and bronchitis.  On arrival patient is tachycardic with a heart rate of 102 with otherwise stable vital signs on room air.  It seems patient's infectious symptoms have all been improving as she states she rarely has any cough anymore and she longer has any earache or sore  throat.  Regarding her chest pain yesterday and lightheadedness today as well as palpitations differential includes but is not limited to ACS, arrhythmia, PE, heart failure, and metabolic derangements  Patient does not have right bundle branch block no significant arrhythmia identified on ECG.  Chest x-ray has no evidence of volume overload.  Does not appear volume overloaded on exam.  BNP is 19.6 embolus patient heart failure at this time.  In addition given she denies any chest pain today and troponin is reassuring at 4x2 today low suspicion for ACS or myocarditis.  Procalcitonin is undetectable and low suspicion for acute infectious bacterial process although it is possible patient has some residual viral bronchitis.  CBC is a mild leukocytosis but no significant acute anemia.  BMP is unremarkable although glucose is elevated 170.  No acidosis or other significant derangements.  A1c was sent.  D-dimer sent to assess for patient's risk for PE.  Care patient signed over to oncoming provider at approximately 1500.  Plan is to follow-up D-dimer and if elevated obtain CTA.  If is not elevated patient likely warrants close cardiology follow-up but do not believe patient with otherwise need admission at this time.    ____________________________________________   FINAL CLINICAL IMPRESSION(S) / ED DIAGNOSES  Final diagnoses:  Palpitations  Lightheadedness    Medications  lactated ringers bolus 1,000 mL (1,000 mLs Intravenous New Bag/Given 02/25/20 1328)     ED Discharge Orders    None       Note:  This document was prepared using Dragon voice recognition software and may include unintentional dictation errors.   Gilles Chiquito, MD 02/25/20 [id number]

## 2020-04-24 ENCOUNTER — Other Ambulatory Visit: Payer: Self-pay | Admitting: Family Medicine

## 2020-04-24 DIAGNOSIS — Z1231 Encounter for screening mammogram for malignant neoplasm of breast: Secondary | ICD-10-CM

## 2020-05-22 ENCOUNTER — Other Ambulatory Visit: Payer: Self-pay

## 2020-05-22 ENCOUNTER — Ambulatory Visit
Admission: RE | Admit: 2020-05-22 | Discharge: 2020-05-22 | Disposition: A | Discharge status: Home / Self Care | Payer: Federal, State, Local not specified - PPO | Source: Ambulatory Visit | Attending: Family Medicine | Admitting: Family Medicine

## 2020-05-22 DIAGNOSIS — Z1231 Encounter for screening mammogram for malignant neoplasm of breast: Secondary | ICD-10-CM | POA: Diagnosis not present

## 2021-07-15 ENCOUNTER — Other Ambulatory Visit: Payer: Self-pay | Admitting: Family Medicine

## 2021-07-15 DIAGNOSIS — Z1231 Encounter for screening mammogram for malignant neoplasm of breast: Secondary | ICD-10-CM

## 2021-07-16 ENCOUNTER — Other Ambulatory Visit
Admission: RE | Admit: 2021-07-16 | Discharge: 2021-07-16 | Disposition: A | Discharge status: Home / Self Care | Payer: Federal, State, Local not specified - PPO | Source: Ambulatory Visit | Attending: Internal Medicine | Admitting: Internal Medicine

## 2021-07-16 DIAGNOSIS — R0602 Shortness of breath: Secondary | ICD-10-CM | POA: Diagnosis present

## 2021-07-16 DIAGNOSIS — R0789 Other chest pain: Secondary | ICD-10-CM | POA: Insufficient documentation

## 2021-07-16 LAB — D-DIMER, QUANTITATIVE: D-Dimer, Quant: 0.65 ug/mL-FEU — ABNORMAL HIGH (ref 0.00–0.50)

## 2021-07-20 ENCOUNTER — Ambulatory Visit
Admission: RE | Admit: 2021-07-20 | Discharge: 2021-07-20 | Disposition: A | Discharge status: Home / Self Care | Payer: Federal, State, Local not specified - PPO | Source: Ambulatory Visit | Attending: Family Medicine | Admitting: Family Medicine

## 2021-07-20 DIAGNOSIS — Z1231 Encounter for screening mammogram for malignant neoplasm of breast: Secondary | ICD-10-CM | POA: Insufficient documentation

## 2022-04-21 ENCOUNTER — Encounter: Payer: Self-pay | Admitting: Emergency Medicine

## 2022-04-21 ENCOUNTER — Ambulatory Visit (INDEPENDENT_AMBULATORY_CARE_PROVIDER_SITE_OTHER): Payer: Federal, State, Local not specified - PPO

## 2022-04-21 ENCOUNTER — Ambulatory Visit
Admission: EM | Admit: 2022-04-21 | Discharge: 2022-04-21 | Disposition: A | Discharge status: Home / Self Care | Payer: Federal, State, Local not specified - PPO | Attending: Family Medicine | Admitting: Family Medicine

## 2022-04-21 DIAGNOSIS — R059 Cough, unspecified: Secondary | ICD-10-CM

## 2022-04-21 DIAGNOSIS — J40 Bronchitis, not specified as acute or chronic: Secondary | ICD-10-CM

## 2022-04-21 DIAGNOSIS — J329 Chronic sinusitis, unspecified: Secondary | ICD-10-CM | POA: Diagnosis not present

## 2022-04-21 MED ORDER — AZITHROMYCIN 250 MG PO TABS: 250.0000 mg | ORAL_TABLET | Freq: Every day | ORAL | 0 refills | Status: AC

## 2022-04-21 MED ORDER — IPRATROPIUM BROMIDE 0.06 % NA SOLN
2.0000 | Freq: Four times a day (QID) | NASAL | 12 refills | Status: AC
Start: 2022-04-21 — End: ?

## 2022-04-21 MED ORDER — PREDNISONE 50 MG PO TABS: 50.0000 mg | ORAL_TABLET | Freq: Every day | ORAL | 0 refills | Status: AC

## 2022-04-21 NOTE — Discharge Instructions (Addendum)
Stop by the pharmacy to pick up your prescriptions. Use your albuterol for your coughing fits. Take Tylenol for headache. Follow up with your primary care provider as needed.  If your cough does not improve, return to the urgent care.

## 2022-04-21 NOTE — ED Triage Notes (Signed)
Pt c/o nasal congestion, sinus pain/pressure, cough, and nausea. Started about 3 weeks ago.

## 2022-04-21 NOTE — ED Provider Notes (Signed)
[facility]    CSN: [id number] Arrival date & time: 04/21/22  1358      History   Chief Complaint Chief Complaint  Patient presents with   Nasal Congestion    HPI Melissa Duncan is a 57 y.o. female.   HPI   Melissa Duncan presents for nasal congestion, cough, sinus pressure with headache for the past 3 weeks. She has yellow-green mucus with blood. Has had nausea but no vomiting. Had some loose stools and abdominal discomfort over this past weekend.  No chills, fever, body aches.      Past Medical History:  Diagnosis Date   Depression    Diabetes mellitus without complication (HCC)    Hypertension     Patient Active Problem List   Diagnosis Date Noted   Severe recurrent major depression without psychotic features (HCC) 12/06/2016   Hypertension 12/06/2016    Past Surgical History:  Procedure Laterality Date   ABDOMINAL HYSTERECTOMY     BREAST BIOPSY Right 2015   neg    OB History     Gravida  4   Para      Term      Preterm      AB      Living         SAB      IAB      Ectopic      Multiple      Live Births               Home Medications    Prior to Admission medications   Medication Sig Start Date End Date Taking? Authorizing Provider  azithromycin (ZITHROMAX Z-PAK) 250 MG tablet Take 1 tablet (250 mg total) by mouth daily. Take 2 tablets on day 1 04/21/22  Yes Britne Borelli, DO  cyanocobalamin (VITAMIN B12) 1000 MCG tablet Take 1,000 mcg by mouth daily.   Yes [provider]  ipratropium (ATROVENT) 0.06 % nasal spray Place 2 sprays into both nostrils 4 (four) times daily. 04/21/22  Yes Archibald Marchetta, DO  irbesartan-hydrochlorothiazide (AVALIDE) 150-12.5 MG tablet Take 1 tablet by mouth daily. 12/08/16  Yes Pucilowska, Jolanta B, MD  predniSONE (DELTASONE) 50 MG tablet Take 1 tablet (50 mg total) by mouth daily for 3 days. 04/21/22 04/24/22 Yes Daymion Nazaire, DO  sertraline (ZOLOFT) 100 MG tablet Take 2 tablets (200  mg total) by mouth daily. 12/08/16  Yes Pucilowska, Jolanta B, MD  Continuous Blood Gluc Receiver (FREESTYLE LIBRE 14 DAY READER) DEVI Use 1 Device as directed 02/14/19   [provider]  Multiple Vitamins-Minerals (ONE-A-DAY 50 PLUS PO) Take 1 tablet by mouth daily.    [provider]  venlafaxine XR (EFFEXOR-XR) 75 MG 24 hr capsule Take 75 mg by mouth daily. 03/31/18   [provider]    Family History Family History  Adopted: Yes    Social History Social History   Tobacco Use   Smoking status: Never   Smokeless tobacco: Never  Substance Use Topics   Alcohol use: No   Drug use: No     Allergies   Ibuprofen and Wellbutrin [bupropion]   Review of Systems Review of Systems: negative unless otherwise stated in HPI.      Physical Exam Triage Vital Signs ED Triage Vitals  Enc Vitals Group     BP 04/21/22 1517 (!) 147/88     Pulse Rate 04/21/22 1517 88     Resp 04/21/22 1517 16     Temp 04/21/22 1517 98.8  F (37.1 C)     Temp Source 04/21/22 1517 Oral     SpO2 04/21/22 1517 96 %     Weight 04/21/22 1515 160 lb 0.9 oz (72.6 kg)     Height 04/21/22 1515 5\' 2"  (1.575 m)     Head Circumference --      Peak Flow --      Pain Score 04/21/22 1514 3     Pain Loc --      Pain Edu? --      Excl. in [place]? --    No data found.  Updated Vital Signs BP (!) 147/88 (BP Location: Right Arm)   Pulse 88   Temp 98.8 F (37.1 C) (Oral)   Resp 16   Ht 5\' 2"  (1.575 m)   Wt 72.6 kg   SpO2 96%   BMI 29.27 kg/m   Visual Acuity Right Eye Distance:   Left Eye Distance:   Bilateral Distance:    Right Eye Near:   Left Eye Near:    Bilateral Near:     Physical Exam GEN:     alert, non-toxic appearing female in no distress    HENT:  mucus membranes moist, oropharyngeal without lesions or exudate,  moderate erythematous edematous turbinates, clear nasal discharge, mild maxillary sinus tenderness, no frontal sinus tenderness EYES:   pupils equal and  reactive, no scleral injection or discharge NECK:  normal ROM RESP:  no increased work of breathing, clear to auscultation bilaterally CVS:   regular rate and rhythm Skin:   warm and dry    UC Treatments / Results  Labs (all labs ordered are listed, but only abnormal results are displayed) Labs Reviewed - No data to display  EKG   Radiology DG Chest 2 View  Result Date: 04/21/2022 CLINICAL DATA:  Cough for 3 weeks EXAM: CHEST - 2 VIEW COMPARISON:  02/25/2020 FINDINGS: The heart size and mediastinal contours are within normal limits. Both lungs are clear. The visualized skeletal structures are unremarkable. IMPRESSION: No active cardiopulmonary disease. Electronically Signed   By: Donavan Foil M.D.   On: 04/21/2022 15:40     Procedures Procedures (including critical care time)  Medications Ordered in UC Medications - No data to display  Initial Impression / Assessment and Plan / UC Course  I have reviewed the triage vital signs and the nursing notes.  Pertinent labs & imaging results that were available during my care of the patient were reviewed by me and considered in my medical decision making (see chart for details).       Pt is a 57 y.o. female who presents for 3 weeks of cough and nasal congestion that is not improving.  Melissa Duncan is afebrile here without recent antipyretics. Satting adequately on room air. Overall pt is  non-toxic appearing, well hydrated, without respiratory distress. Pulmonary exam is unremarkable.  She has maxillary sinus tenderness with boggy erythematous turbinates, on exam. After shared decision making, we pursued a chest x-ray did not show any acute focal pneumonia, pleural effusion or pneumothorax.  Radiologist agreed that there is no acute cardiopulmonary process.Marland Kitchen  COVID  and influenza testing deferred due to length of symptoms.   Treat acute sinobronchitis with steroids and antibiotics as below.  Atrovent nasal spray for nasal congestion.   Typical duration of symptoms discussed. Return and ED precautions given and patient voiced understanding.   Discussed MDM, treatment plan and plan for follow-up with patient who agrees with plan.      Final  Clinical Impressions(s) / UC Diagnoses   Final diagnoses:  Sinobronchitis     Discharge Instructions      Stop by the pharmacy to pick up your prescriptions. Use your albuterol for your coughing fits. Take Tylenol for headache. Follow up with your primary care provider as needed.  If your cough does not improve, return to the urgent care.       ED Prescriptions     Medication Sig Dispense Auth. Provider   azithromycin (ZITHROMAX Z-PAK) 250 MG tablet Take 1 tablet (250 mg total) by mouth daily. Take 2 tablets on day 1 6 tablet Kingson Lohmeyer, DO   predniSONE (DELTASONE) 50 MG tablet Take 1 tablet (50 mg total) by mouth daily for 3 days. 3 tablet Derell Bruun, DO   ipratropium (ATROVENT) 0.06 % nasal spray Place 2 sprays into both nostrils 4 (four) times daily. 15 mL Lyndee Hensen, DO      PDMP not reviewed this encounter.   Lyndee Hensen, DO 04/24/22 2306

## 2022-06-16 ENCOUNTER — Other Ambulatory Visit: Payer: Self-pay | Admitting: Family Medicine

## 2022-06-16 DIAGNOSIS — Z1231 Encounter for screening mammogram for malignant neoplasm of breast: Secondary | ICD-10-CM

## 2022-06-22 ENCOUNTER — Ambulatory Visit: Payer: Federal, State, Local not specified - PPO

## 2022-07-26 ENCOUNTER — Ambulatory Visit
Admission: RE | Admit: 2022-07-26 | Discharge: 2022-07-26 | Disposition: A | Discharge status: Home / Self Care | Payer: Federal, State, Local not specified - PPO | Source: Ambulatory Visit | Attending: Family Medicine | Admitting: Family Medicine

## 2022-07-26 DIAGNOSIS — Z1231 Encounter for screening mammogram for malignant neoplasm of breast: Secondary | ICD-10-CM | POA: Diagnosis present

## 2022-07-29 ENCOUNTER — Other Ambulatory Visit: Payer: Self-pay | Admitting: Family Medicine

## 2022-07-29 DIAGNOSIS — R921 Mammographic calcification found on diagnostic imaging of breast: Secondary | ICD-10-CM

## 2022-07-29 DIAGNOSIS — R928 Other abnormal and inconclusive findings on diagnostic imaging of breast: Secondary | ICD-10-CM

## 2022-07-30 ENCOUNTER — Ambulatory Visit
Admission: RE | Admit: 2022-07-30 | Discharge: 2022-07-30 | Disposition: A | Discharge status: Home / Self Care | Payer: Federal, State, Local not specified - PPO | Source: Ambulatory Visit | Attending: Family Medicine | Admitting: Family Medicine

## 2022-07-30 DIAGNOSIS — R921 Mammographic calcification found on diagnostic imaging of breast: Secondary | ICD-10-CM | POA: Insufficient documentation

## 2022-07-30 DIAGNOSIS — R928 Other abnormal and inconclusive findings on diagnostic imaging of breast: Secondary | ICD-10-CM | POA: Insufficient documentation

## 2022-08-02 ENCOUNTER — Other Ambulatory Visit: Payer: Self-pay | Admitting: Family Medicine

## 2022-08-02 ENCOUNTER — Ambulatory Visit
Admission: RE | Admit: 2022-08-02 | Discharge: 2022-08-02 | Disposition: A | Discharge status: Home / Self Care | Payer: Federal, State, Local not specified - PPO | Source: Ambulatory Visit | Attending: Family Medicine | Admitting: Family Medicine

## 2022-08-02 DIAGNOSIS — R928 Other abnormal and inconclusive findings on diagnostic imaging of breast: Secondary | ICD-10-CM

## 2022-08-02 DIAGNOSIS — R921 Mammographic calcification found on diagnostic imaging of breast: Secondary | ICD-10-CM

## 2022-08-03 ENCOUNTER — Other Ambulatory Visit: Payer: Self-pay | Admitting: Family Medicine

## 2022-08-03 DIAGNOSIS — R928 Other abnormal and inconclusive findings on diagnostic imaging of breast: Secondary | ICD-10-CM

## 2022-08-03 DIAGNOSIS — N63 Unspecified lump in unspecified breast: Secondary | ICD-10-CM

## 2022-08-09 ENCOUNTER — Ambulatory Visit
Admission: RE | Admit: 2022-08-09 | Discharge: 2022-08-09 | Disposition: A | Discharge status: Home / Self Care | Payer: Federal, State, Local not specified - PPO | Source: Ambulatory Visit | Attending: Family Medicine | Admitting: Family Medicine

## 2022-08-09 DIAGNOSIS — N63 Unspecified lump in unspecified breast: Secondary | ICD-10-CM | POA: Insufficient documentation

## 2022-08-09 DIAGNOSIS — R928 Other abnormal and inconclusive findings on diagnostic imaging of breast: Secondary | ICD-10-CM | POA: Insufficient documentation

## 2022-08-09 HISTORY — PX: BREAST BIOPSY: SHX20

## 2022-08-09 HISTORY — PX: OTHER SURGICAL HISTORY: SHX169

## 2022-08-09 MED ORDER — LIDOCAINE-EPINEPHRINE 1 %-1:100000 IJ SOLN
8.0000 mL | Freq: Once | INTRAMUSCULAR | Status: AC
  Administered 2022-08-09: 8 mL
  Filled 2022-08-09: qty 8

## 2022-08-09 MED ORDER — LIDOCAINE HCL (PF) 1 % IJ SOLN
2.0000 mL | Freq: Once | INTRAMUSCULAR | Status: AC
  Administered 2022-08-09: 2 mL via INTRADERMAL
  Filled 2022-08-09: qty 2

## 2022-08-10 LAB — SURGICAL PATHOLOGY

## 2022-08-12 ENCOUNTER — Other Ambulatory Visit: Payer: Self-pay | Admitting: Family Medicine

## 2022-08-12 DIAGNOSIS — R928 Other abnormal and inconclusive findings on diagnostic imaging of breast: Secondary | ICD-10-CM

## 2022-08-12 DIAGNOSIS — N63 Unspecified lump in unspecified breast: Secondary | ICD-10-CM

## 2022-08-24 ENCOUNTER — Ambulatory Visit
Admission: RE | Admit: 2022-08-24 | Discharge: 2022-08-24 | Disposition: A | Discharge status: Home / Self Care | Payer: Federal, State, Local not specified - PPO | Source: Ambulatory Visit | Attending: Family Medicine | Admitting: Family Medicine

## 2022-08-24 DIAGNOSIS — R928 Other abnormal and inconclusive findings on diagnostic imaging of breast: Secondary | ICD-10-CM | POA: Insufficient documentation

## 2022-08-24 DIAGNOSIS — N63 Unspecified lump in unspecified breast: Secondary | ICD-10-CM | POA: Diagnosis present

## 2022-08-24 HISTORY — PX: BREAST BIOPSY: SHX20

## 2022-08-24 MED ORDER — LIDOCAINE-EPINEPHRINE 1 %-1:100000 IJ SOLN
10.0000 mL | Freq: Once | INTRAMUSCULAR | Status: AC
  Administered 2022-08-24: 10 mL
  Filled 2022-08-24: qty 10

## 2022-08-24 MED ORDER — LIDOCAINE HCL 1 % IJ SOLN
15.0000 mL | Freq: Once | INTRAMUSCULAR | Status: AC
  Administered 2022-08-24: 15 mL
  Filled 2022-08-24: qty 15

## 2022-08-25 LAB — SURGICAL PATHOLOGY
